# Patient Record
Sex: Male | Born: 1965 | Race: Black or African American | Hispanic: No | Marital: Married | Smoking: Former smoker
Health system: Southern US, Community
[De-identification: ages and names within clinical notes are randomized; demographics above are authoritative.]

## PROBLEM LIST (undated history)

## (undated) DIAGNOSIS — E785 Hyperlipidemia, unspecified: Secondary | ICD-10-CM

## (undated) DIAGNOSIS — I4891 Unspecified atrial fibrillation: Secondary | ICD-10-CM

## (undated) DIAGNOSIS — I517 Cardiomegaly: Secondary | ICD-10-CM

## (undated) DIAGNOSIS — I10 Essential (primary) hypertension: Secondary | ICD-10-CM

## (undated) DIAGNOSIS — G4733 Obstructive sleep apnea (adult) (pediatric): Secondary | ICD-10-CM

## (undated) DIAGNOSIS — W3400XA Accidental discharge from unspecified firearms or gun, initial encounter: Secondary | ICD-10-CM

## (undated) DIAGNOSIS — I429 Cardiomyopathy, unspecified: Secondary | ICD-10-CM

## (undated) HISTORY — DX: Cardiomyopathy, unspecified: I42.9

## (undated) HISTORY — DX: Morbid (severe) obesity due to excess calories: E66.01

## (undated) HISTORY — DX: Accidental discharge from unspecified firearms or gun, initial encounter: W34.00XA

## (undated) HISTORY — DX: Hyperlipidemia, unspecified: E78.5

## (undated) HISTORY — DX: Cardiomegaly: I51.7

## (undated) HISTORY — DX: Obstructive sleep apnea (adult) (pediatric): G47.33

---
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
## 1999-05-31 ENCOUNTER — Ambulatory Visit (HOSPITAL_COMMUNITY): Admission: RE | Admit: 1999-05-31 | Discharge: 1999-05-31 | Payer: Self-pay | Admitting: Nephrology

## 1999-05-31 ENCOUNTER — Encounter: Payer: Self-pay | Admitting: Nephrology

## 1999-06-02 ENCOUNTER — Ambulatory Visit (HOSPITAL_COMMUNITY): Admission: RE | Admit: 1999-06-02 | Discharge: 1999-06-02 | Payer: Self-pay | Admitting: Nephrology

## 1999-06-02 ENCOUNTER — Encounter: Payer: Self-pay | Admitting: Nephrology

## 1999-06-14 ENCOUNTER — Emergency Department (HOSPITAL_COMMUNITY): Admission: EM | Admit: 1999-06-14 | Discharge: 1999-06-14 | Payer: Self-pay | Admitting: Emergency Medicine

## 2006-01-21 ENCOUNTER — Emergency Department (HOSPITAL_COMMUNITY): Admission: EM | Admit: 2006-01-21 | Discharge: 2006-01-21 | Payer: Self-pay | Admitting: Emergency Medicine

## 2009-01-26 DIAGNOSIS — I4819 Other persistent atrial fibrillation: Secondary | ICD-10-CM

## 2009-01-26 HISTORY — DX: Other persistent atrial fibrillation: I48.19

## 2012-02-26 ENCOUNTER — Emergency Department (HOSPITAL_COMMUNITY)
Admission: EM | Admit: 2012-02-26 | Discharge: 2012-02-27 | Disposition: A | Discharge status: Home / Self Care | Payer: No Typology Code available for payment source | Attending: Emergency Medicine | Admitting: Emergency Medicine

## 2012-02-26 ENCOUNTER — Encounter (HOSPITAL_COMMUNITY): Payer: Self-pay | Admitting: *Deleted

## 2012-02-26 DIAGNOSIS — F17200 Nicotine dependence, unspecified, uncomplicated: Secondary | ICD-10-CM | POA: Insufficient documentation

## 2012-02-26 DIAGNOSIS — M545 Low back pain, unspecified: Secondary | ICD-10-CM | POA: Insufficient documentation

## 2012-02-26 DIAGNOSIS — M25519 Pain in unspecified shoulder: Secondary | ICD-10-CM | POA: Insufficient documentation
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
## 2012-02-27 MED ORDER — OXYCODONE-ACETAMINOPHEN 5-325 MG PO TABS: 1.0000 | ORAL_TABLET | ORAL | Status: DC | PRN
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
## 2015-08-31 ENCOUNTER — Ambulatory Visit: Payer: Self-pay | Attending: Internal Medicine

## 2015-09-08 ENCOUNTER — Ambulatory Visit: Payer: Self-pay | Admitting: Family Medicine

## 2019-02-05 ENCOUNTER — Inpatient Hospital Stay (HOSPITAL_COMMUNITY)
Admission: EM | Admit: 2019-02-05 | Discharge: 2019-02-09 | DRG: 308 | Disposition: A | Discharge status: Home / Self Care | Payer: No Typology Code available for payment source | Attending: Cardiovascular Disease | Admitting: Cardiovascular Disease

## 2019-02-05 ENCOUNTER — Encounter (HOSPITAL_COMMUNITY): Payer: Self-pay | Admitting: Emergency Medicine

## 2019-02-05 ENCOUNTER — Other Ambulatory Visit: Payer: Self-pay

## 2019-02-05 ENCOUNTER — Emergency Department (HOSPITAL_COMMUNITY): Payer: Self-pay

## 2019-02-05 DIAGNOSIS — I493 Ventricular premature depolarization: Secondary | ICD-10-CM | POA: Diagnosis not present

## 2019-02-05 DIAGNOSIS — E669 Obesity, unspecified: Secondary | ICD-10-CM | POA: Diagnosis present

## 2019-02-05 DIAGNOSIS — E876 Hypokalemia: Secondary | ICD-10-CM | POA: Diagnosis not present

## 2019-02-05 DIAGNOSIS — Z8249 Family history of ischemic heart disease and other diseases of the circulatory system: Secondary | ICD-10-CM

## 2019-02-05 DIAGNOSIS — I10 Essential (primary) hypertension: Secondary | ICD-10-CM

## 2019-02-05 DIAGNOSIS — E119 Type 2 diabetes mellitus without complications: Secondary | ICD-10-CM | POA: Diagnosis present

## 2019-02-05 DIAGNOSIS — Z6834 Body mass index (BMI) 34.0-34.9, adult: Secondary | ICD-10-CM

## 2019-02-05 DIAGNOSIS — F1210 Cannabis abuse, uncomplicated: Secondary | ICD-10-CM | POA: Diagnosis present

## 2019-02-05 DIAGNOSIS — I4891 Unspecified atrial fibrillation: Secondary | ICD-10-CM | POA: Diagnosis not present

## 2019-02-05 DIAGNOSIS — R079 Chest pain, unspecified: Secondary | ICD-10-CM

## 2019-02-05 DIAGNOSIS — Z833 Family history of diabetes mellitus: Secondary | ICD-10-CM

## 2019-02-05 DIAGNOSIS — F17290 Nicotine dependence, other tobacco product, uncomplicated: Secondary | ICD-10-CM | POA: Diagnosis present

## 2019-02-05 DIAGNOSIS — I5021 Acute systolic (congestive) heart failure: Secondary | ICD-10-CM | POA: Diagnosis present

## 2019-02-05 DIAGNOSIS — Z23 Encounter for immunization: Secondary | ICD-10-CM

## 2019-02-05 DIAGNOSIS — E118 Type 2 diabetes mellitus with unspecified complications: Secondary | ICD-10-CM

## 2019-02-05 DIAGNOSIS — I110 Hypertensive heart disease with heart failure: Secondary | ICD-10-CM | POA: Diagnosis present

## 2019-02-05 DIAGNOSIS — Z20828 Contact with and (suspected) exposure to other viral communicable diseases: Secondary | ICD-10-CM | POA: Diagnosis present

## 2019-02-05 DIAGNOSIS — N179 Acute kidney failure, unspecified: Secondary | ICD-10-CM | POA: Diagnosis present

## 2019-02-05 DIAGNOSIS — I248 Other forms of acute ischemic heart disease: Secondary | ICD-10-CM | POA: Diagnosis present

## 2019-02-05 DIAGNOSIS — Z720 Tobacco use: Secondary | ICD-10-CM

## 2019-02-05 HISTORY — DX: Essential (primary) hypertension: I10

## 2019-02-05 LAB — BASIC METABOLIC PANEL
Anion gap: 10 (ref 5–15)
BUN: 21 mg/dL — ABNORMAL HIGH (ref 6–20)
CO2: 19 mmol/L — ABNORMAL LOW (ref 22–32)
Calcium: 8.9 mg/dL (ref 8.9–10.3)
Chloride: 110 mmol/L (ref 98–111)
Creatinine, Ser: 1.35 mg/dL — ABNORMAL HIGH (ref 0.61–1.24)
GFR calc Af Amer: >60 mL/min (ref >60)
GFR calc non Af Amer: >60 mL/min (ref >60)
Glucose, Bld: 131 mg/dL — ABNORMAL HIGH (ref 70–99)
Potassium: 3.8 mmol/L (ref 3.5–5.1)
Sodium: 139 mmol/L (ref 135–145)

## 2019-02-05 LAB — CBC
HCT: 44.3 % (ref 39.0–52.0)
Hemoglobin: 14.9 g/dL (ref 13.0–17.0)
MCH: 32.2 pg (ref 26.0–34.0)
MCHC: 33.6 g/dL (ref 30.0–36.0)
MCV: 95.7 fL (ref 80.0–100.0)
Platelets: 248 10*3/uL (ref 150–400)
RBC: 4.63 MIL/uL (ref 4.22–5.81)
RDW: 14.3 % (ref 11.5–15.5)
WBC: 10 10*3/uL (ref 4.0–10.5)
nRBC: 0 % (ref 0.0–0.2)

## 2019-02-05 LAB — SARS CORONAVIRUS 2 BY RT PCR (HOSPITAL ORDER, PERFORMED IN ~~LOC~~ HOSPITAL LAB): SARS Coronavirus 2: NEGATIVE

## 2019-02-05 LAB — TROPONIN I (HIGH SENSITIVITY)
Troponin I (High Sensitivity): 23 ng/L — ABNORMAL HIGH (ref <18)
Troponin I (High Sensitivity): 22 ng/L — ABNORMAL HIGH (ref <18)

## 2019-02-05 LAB — TSH: TSH: 3.649 u[IU]/mL (ref 0.350–4.500)

## 2019-02-05 LAB — BRAIN NATRIURETIC PEPTIDE: B Natriuretic Peptide: 333 pg/mL — ABNORMAL HIGH (ref 0.0–100.0)

## 2019-02-05 MED ORDER — DILTIAZEM HCL-DEXTROSE 100-5 MG/100ML-% IV SOLN (PREMIX): 5.0000 mg/h | INTRAVENOUS | Status: DC

## 2019-02-05 MED ORDER — NITROGLYCERIN 0.4 MG SL SUBL: 0.4000 mg | SUBLINGUAL_TABLET | SUBLINGUAL | Status: DC | PRN

## 2019-02-05 MED ORDER — METOPROLOL TARTRATE 25 MG PO TABS
50.0000 mg | ORAL_TABLET | Freq: Two times a day (BID) | ORAL | Status: DC
  Administered 2019-02-05: 20:00:00 50 mg via ORAL
  Filled 2019-02-05: qty 2

## 2019-02-05 MED ORDER — DILTIAZEM LOAD VIA INFUSION
10.0000 mg | Freq: Once | INTRAVENOUS | Status: AC
  Administered 2019-02-05: 10 mg via INTRAVENOUS
  Filled 2019-02-05: qty 10

## 2019-02-05 MED ORDER — HEPARIN (PORCINE) 25000 UT/250ML-% IV SOLN: 1700.0000 [IU]/h | INTRAVENOUS | Status: DC

## 2019-02-05 MED ORDER — HEPARIN SODIUM (PORCINE) 5000 UNIT/ML IJ SOLN
4000.0000 [IU] | Freq: Once | INTRAMUSCULAR | Status: AC
  Administered 2019-02-05: 4000 [IU] via INTRAVENOUS
  Filled 2019-02-05: qty 1

## 2019-02-05 MED ORDER — ONDANSETRON HCL 4 MG/2ML IJ SOLN: 4.0000 mg | Freq: Four times a day (QID) | INTRAMUSCULAR | Status: DC | PRN

## 2019-02-05 MED ORDER — ACETAMINOPHEN 325 MG PO TABS
650.0000 mg | ORAL_TABLET | ORAL | Status: DC | PRN
  Administered 2019-02-06: 650 mg via ORAL
  Filled 2019-02-05: qty 2

## 2019-02-05 MED ORDER — APIXABAN 5 MG PO TABS
5.0000 mg | ORAL_TABLET | Freq: Two times a day (BID) | ORAL | Status: DC
  Administered 2019-02-05 – 2019-02-09 (×8): 5 mg via ORAL
  Filled 2019-02-05 (×9): qty 1

## 2019-02-05 MED ORDER — DILTIAZEM HCL-DEXTROSE 100-5 MG/100ML-% IV SOLN (PREMIX)
5.0000 mg/h | INTRAVENOUS | Status: DC
  Administered 2019-02-05: 5 mg/h via INTRAVENOUS
  Administered 2019-02-05: 22:00:00 15 mg/h via INTRAVENOUS
  Administered 2019-02-07: 01:00:00 10 mg/h via INTRAVENOUS
  Filled 2019-02-05 (×6): qty 100

## 2019-02-05 NOTE — ED Triage Notes (Signed)
Patient reports being sent from [place] in [place] due to his heart rate being elevated. Patient reports feeling off with some shortness of breath x 3 days. Patient reports having a slight tightness to left chest.

## 2019-02-05 NOTE — H&P (Addendum)
Cardiology Admission History and Physical:   Patient ID: KOEHN HARROW MRN: [id number]; DOB: 21-Oct-1965   Admission date: 02/05/2019  Primary Care Provider: Patient, No Pcp Per Primary Cardiologist: Dr. Eden Emms ([place])  Chief Complaint:  afib rvr  Patient Profile:   Robert Middleton is a 53 y.o. male with history of hypertension and strong family history of CAD presents for evaluation of atrial fibrillation with rapid ventricular rate from [place].  He was diagnosed with hypertension February 2020.  He takes amlodipine 5 mg daily.  He smokes cigar and marijuana multiple days per week, but not every day.  He drinks 1 pint per week.  He works as a Aeronautical engineer person.  Father had MI at age 7 requiring CABG.  Died of cardiac complication at age 60. Elder brother had MI at age 66. Brother has diabetes.  History of Present Illness:   Mr. Perpich started to feel fatigued and tired last Thursday.  Since Monday he has intermittent palpitation with shortness of breath.  He reports constant palpitation for past 2 days.  He has associated shortness of breath.  No chest pain, orthopnea, dizziness, PND, syncope, lower extremity edema or melena.  Compliant with low-sodium diet and his medication.  He went to see his provider at [facility] and noted to be in atrial fibrillation with rapid ventricular rate and sent to ER.  Heart rate sustaining in 150s.  He just started on IV Cardizem and IV heparin.  Blood pressure 128/97.  BNP 333.  Glucose 131.  Creatinine 1.35.  Chest x-ray with pulmonary edema likely.  Heart Pathway Score:  HEAR Score: 2  Past Medical History:  Diagnosis Date  . Hypertension     History reviewed. No pertinent surgical history.   Medications Prior to Admission: Prior to Admission medications   Medication Sig Start Date End Date Taking? Authorizing Provider  amLODipine (NORVASC) 5 MG tablet Take 5 mg by mouth daily.   Yes [provider]  BLACK ELDERBERRY PO Take 1 tablet by  mouth daily. W/ cloves and honey   Yes [provider]  ibuprofen (ADVIL) 200 MG tablet Take 400 mg by mouth every 6 (six) hours as needed for moderate pain.   Yes [provider]  Multiple Vitamins-Minerals (MULTIVITAMIN WITH MINERALS) tablet Take 1 tablet by mouth daily.   Yes [provider]  naproxen sodium (ANAPROX) 220 MG tablet Take 220 mg by mouth 2 (two) times daily with a meal.    [provider]  oxyCODONE-acetaminophen (PERCOCET/ROXICET) 5-325 MG per tablet Take 1 tablet by mouth every 4 (four) hours as needed for pain. Patient not taking: Reported on 02/05/2019 02/27/12   Zadie Rhine, MD     Allergies:   No Known Allergies  Social History:   Social History   Socioeconomic History  . Marital status: Single    Spouse name: Not on file  . Number of children: Not on file  . Years of education: Not on file  . Highest education level: Not on file  Occupational History  . Not on file  Social Needs  . Financial resource strain: Not on file  . Food insecurity    Worry: Not on file    Inability: Not on file  . Transportation needs    Medical: Not on file    Non-medical: Not on file  Tobacco Use  . Smoking status: Current Every Day Smoker  . Smokeless tobacco: Never Used  Substance and Sexual Activity  . Alcohol use: Yes  .  Drug use: Not on file  . Sexual activity: Not on file  Lifestyle  . Physical activity    Days per week: Not on file    Minutes per session: Not on file  . Stress: Not on file  Relationships  . Social Herbalist on phone: Not on file    Gets together: Not on file    Attends religious service: Not on file    Active member of club or organization: Not on file    Attends meetings of clubs or organizations: Not on file    Relationship status: Not on file  . Intimate partner violence    Fear of current or ex partner: Not on file    Emotionally abused: Not on file    Physically abused: Not on file     Forced sexual activity: Not on file  Other Topics Concern  . Not on file  Social History Narrative  . Not on file    Family History:  The patient's family history includes CAD in his mother; Heart attack (age of onset: 19) in his mother; Heart attack (age of onset: 66) in his brother; Heart disease in his mother.    ROS:  Please see the history of present illness.  All other ROS reviewed and negative.     Physical Exam/Data:   Vitals:   02/05/19 1316 02/05/19 1333 02/05/19 1345 02/05/19 1415  BP:  (!) 124/103 (!) 120/97 (!) 119/98  Pulse:  (!) 144 73 67  Resp:  (!) 27 13 (!) 26  Temp:      TempSrc:      SpO2:  97% 94% 97%  Weight: 116.1 kg     Height: 6' (1.829 m)      No intake or output data in the 24 hours ending 02/05/19 1545 Last 3 Weights 02/05/2019  Weight (lbs) 256 lb  Weight (kg) 116.121 kg     Body mass index is 34.72 kg/m.  General:  Well nourished, well developed, in no acute distress HEENT: normal Lymph: no adenopathy Neck: no JVD Endocrine:  No thryomegaly Vascular: No carotid bruits; FA pulses 2+ bilaterally without bruits  Cardiac:  normal S1, S2; irregularly irregular tachycardic; no murmur  Lungs: Diminished breath sounds at base bilaterally abd: soft, nontender, no hepatomegaly  Ext: Trace edema Musculoskeletal:  No deformities, BUE and BLE strength normal and equal Skin: warm and dry  Neuro:  CNs 2-12 intact, no focal abnormalities noted Psych:  Normal affect    EKG:  The ECG that was done today was personally reviewed and demonstrates atrial fibrillation at rate of 164 bpm  Relevant CV Studies: As above  Laboratory Data:  Chemistry Recent Labs  Lab 02/05/19 1319  NA 139  K 3.8  CL 110  CO2 19*  GLUCOSE 131*  BUN 21*  CREATININE 1.35*  CALCIUM 8.9  GFRNONAA >60  GFRAA >60  ANIONGAP 10    Hematology Recent Labs  Lab 02/05/19 1319  WBC 10.0  RBC 4.63  HGB 14.9  HCT 44.3  MCV 95.7  MCH 32.2  MCHC 33.6  RDW 14.3  PLT  248   BNP Recent Labs  Lab 02/05/19 1443  BNP 333.0*     Radiology/Studies:  Dg Chest 2 View  Result Date: 02/05/2019 CLINICAL DATA:  Shortness of breath EXAM: CHEST - 2 VIEW COMPARISON:  None. FINDINGS: Cardiomegaly. Mild, diffuse bilateral interstitial pulmonary opacity and trace pleural effusions. Disc degenerative disease of the thoracic spine. IMPRESSION: Cardiomegaly  with mild, diffuse bilateral interstitial pulmonary opacity and trace pleural effusions, likely edema. No focal airspace opacity. Electronically Signed   By: Lauralyn PrimesAlex  Bibbey M.D.   On: 02/05/2019 14:26    Assessment and Plan:   1. New onset atrial fibrillation with rapid ventricular rate He is symptomatic with rapid rate.  Agree with IV diltiazem.  Chadvascs of 1 for hypertension.  Get TSH and echocardiogram.  Check A1c for elevated blood sugar. - Add lopressor 50mg  BID. Stop heparin. Add eliquis 5 mg BID.   2.  Hypertension -Blood pressure stable.  Hold home amlodipine. Add BB and IV cardizem as above.   3.  Cigar and marijuana abuse -Advised cessation.  Severity of Illness: The appropriate patient status for this patient is OBSERVATION. Observation status is judged to be reasonable and necessary in order to provide the required intensity of service to ensure the patient's safety. The patient's presenting symptoms, physical exam findings, and initial radiographic and laboratory data in the context of their medical condition is felt to place them at decreased risk for further clinical deterioration. Furthermore, it is anticipated that the patient will be medically stable for discharge from the hospital within 2 midnights of admission. The following factors support the patient status of observation.   " The patient's presenting symptoms include palpitations . " The physical exam findings include mild edema " The initial radiographic and laboratory data are AFib RBR     For questions or updates, please contact [facility] Please consult [email or web address] for contact info under        Lorelei PontSigned, Bhavinkumar Bhagat, [place]  02/05/2019 3:45 PM   Patient examined chart reviewed discussed care with patient, son and PA. Obese black male no murmur clear lungs no edema good peripheral pulses no bruits No bleeding diathesis. Admitted with no onset afib CHADVASC 1 for HTN but will start eliquis and continue for at least a month post conversion. Start lopressor 50 mg PO bid D/C iv cardizem in am Echo to assess heart in am. Consider d/c in am if echo ok and rate controlled Elective DCC 3 weeks if he does not convert on his own. Check TSH Discussed cessation of smoking and marijuana use   Charlton HawsPeter Tomeika Weinmann

## 2019-02-05 NOTE — ED Notes (Signed)
Patient transported to X-ray 

## 2019-02-05 NOTE — Progress Notes (Signed)
ANTICOAGULATION CONSULT NOTE - Initial Consult  Pharmacy Consult for Heparin Indication: atrial fibrillation  No Known Allergies  Patient Measurements: Height: 6' (182.9 cm) Weight: 256 lb (116.1 kg) IBW/kg (Calculated) : 77.6 Heparin Dosing Weight: 102.7 kg  Vital Signs: Temp: 98.3 F (36.8 C) (09/10 1309) Temp Source: Oral (09/10 1309) BP: 119/98 (09/10 1415) Pulse Rate: 67 (09/10 1415)  Labs: Recent Labs    02/05/19 1319  HGB 14.9  HCT 44.3  PLT 248  CREATININE 1.35*    Estimated Creatinine Clearance: 85.2 mL/min (A) (by C-G formula based on SCr of 1.35 mg/dL (H)).   Medical History: Past Medical History:  Diagnosis Date  . Hypertension     Medications:  Scheduled:  . diltiazem  10 mg Intravenous Once   Infusions:  . diltiazem (CARDIZEM) infusion    . heparin      Assessment: 53 y.o. male presenting with chest discomfort. Newly diagnosed with Afib on 9/6. No anticoagulation PTA. Hgb 14.9, Plts 248, Scr 1.35. Pharmacy consulted for heparin dosing.    Goal of Therapy:  Heparin level 0.3-0.7 units/ml Monitor platelets by anticoagulation protocol: Yes   Plan: Heparin bolus 4000 units Heparin 1700 units/hr Check 6 hr initial HL Daily HL and CBC Monitor s/sx of bleeding   Lorel Monaco, PharmD PGY1 Ambulatory Care Resident [employer] # [phone]

## 2019-02-05 NOTE — ED Provider Notes (Signed)
[facility] EMERGENCY DEPARTMENT Provider Note   CSN: [id number] Arrival date & time: 02/05/19  1300     History   Chief Complaint Chief Complaint  Patient presents with  . Atrial Fibrillation    HPI Robert Middleton is a 53 y.o. male.     Pt reports he began feeling some chest discomfort last Thursday Sept 3rd.  Pt reports he felt like his heart began beating fast on Sunday (6th) Pt went [facility] today  and was diagnosed with AFib with RVR.  Pt was sent here for evaluation.  Pt has EKG with him that shows afib with rvr.   The history is provided by the patient. No language interpreter was used.  Atrial Fibrillation This is a new problem. The current episode started more than 2 days ago. The problem occurs constantly. The problem has not changed since onset.Pertinent negatives include no abdominal pain. Nothing aggravates the symptoms. Nothing relieves the symptoms. He has tried nothing for the symptoms. The treatment provided moderate relief.    Past Medical History:  Diagnosis Date  . Hypertension     There are no active problems to display for this patient.   History reviewed. No pertinent surgical history.      Home Medications    Prior to Admission medications   Medication Sig Start Date End Date Taking? Authorizing Provider  amLODipine (NORVASC) 5 MG tablet Take 5 mg by mouth daily.   Yes [provider]  BLACK ELDERBERRY PO Take 1 tablet by mouth daily. W/ cloves and honey   Yes [provider]  ibuprofen (ADVIL) 200 MG tablet Take 400 mg by mouth every 6 (six) hours as needed for moderate pain.   Yes [provider]  Multiple Vitamins-Minerals (MULTIVITAMIN WITH MINERALS) tablet Take 1 tablet by mouth daily.   Yes [provider]  naproxen sodium (ANAPROX) 220 MG tablet Take 220 mg by mouth 2 (two) times daily with a meal.    [provider]  oxyCODONE-acetaminophen (PERCOCET/ROXICET)  5-325 MG per tablet Take 1 tablet by mouth every 4 (four) hours as needed for pain. Patient not taking: Reported on 02/05/2019 02/27/12   Ripley Fraise, MD    Family History No family history on file.  Social History Social History   Tobacco Use  . Smoking status: Current Every Day Smoker  . Smokeless tobacco: Never Used  Substance Use Topics  . Alcohol use: Yes  . Drug use: Not on file     Allergies   Patient has no known allergies.   Review of Systems Review of Systems  Respiratory: Positive for chest tightness.   Cardiovascular: Positive for palpitations.  Gastrointestinal: Negative for abdominal pain.  All other systems reviewed and are negative.    Physical Exam Updated Vital Signs BP (!) 119/98   Pulse 67   Temp 98.3 F (36.8 C) (Oral)   Resp (!) 26   Ht 6' (1.829 m)   Wt 116.1 kg   SpO2 97%   BMI 34.72 kg/m   Physical Exam Vitals signs and nursing note reviewed.  Constitutional:      Appearance: Normal appearance.  HENT:     Head: Normocephalic and atraumatic.     Right Ear: Tympanic membrane normal.     Left Ear: Tympanic membrane normal.     Nose: Nose normal.     Mouth/Throat:     Mouth: Mucous membranes are moist.  Eyes:     Extraocular Movements:  Extraocular movements intact.     Pupils: Pupils are equal, round, and reactive to light.  Neck:     Musculoskeletal: Normal range of motion.  Cardiovascular:     Rate and Rhythm: Tachycardia present.     Pulses: Normal pulses.  Pulmonary:     Effort: Pulmonary effort is normal.  Abdominal:     General: Abdomen is flat.  Musculoskeletal: Normal range of motion.  Skin:    General: Skin is warm.  Neurological:     General: No focal deficit present.     Mental Status: He is alert.  Psychiatric:        Mood and Affect: Mood normal.      ED Treatments / Results  Labs (all labs ordered are listed, but only abnormal results are displayed) Labs Reviewed  BASIC METABOLIC PANEL -  Abnormal; Notable for the following components:      Result Value   CO2 19 (*)    Glucose, Bld 131 (*)    BUN 21 (*)    Creatinine, Ser 1.35 (*)    All other components within normal limits  CBC    EKG None  Radiology Dg Chest 2 View  Result Date: 02/05/2019 CLINICAL DATA:  Shortness of breath EXAM: CHEST - 2 VIEW COMPARISON:  None. FINDINGS: Cardiomegaly. Mild, diffuse bilateral interstitial pulmonary opacity and trace pleural effusions. Disc degenerative disease of the thoracic spine. IMPRESSION: Cardiomegaly with mild, diffuse bilateral interstitial pulmonary opacity and trace pleural effusions, likely edema. No focal airspace opacity. Electronically Signed   By: Lauralyn Primes M.D.   On: 02/05/2019 14:26    Procedures Procedures (including critical care time)  Medications Ordered in ED Medications  diltiazem (CARDIZEM) 1 mg/mL load via infusion 10 mg (has no administration in time range)    And  diltiazem (CARDIZEM) 100 mg in dextrose 5% (1 mg/mL) infusion (has no administration in time range)     Initial Impression / Assessment and Plan / ED Course  I have reviewed the triage vital signs and the nursing notes.  Pertinent labs & imaging results that were available during my care of the patient were reviewed by me and considered in my medical decision making (see chart for details).         MDM  EKG shows afib with rvr. [facility]  Pt started on cardizem bolus and drip,  Heparin ordered.   Cardiology consulted.   Final Clinical Impressions(s) / ED Diagnoses   Final diagnoses:  Atrial fibrillation with RVR (HCC)  Chest pain, unspecified type    ED Discharge Orders    None       Elson Areas, [place] 02/05/19 1509    Arby Barrette, MD 02/06/19 1145

## 2019-02-06 ENCOUNTER — Encounter (HOSPITAL_COMMUNITY): Payer: Self-pay

## 2019-02-06 ENCOUNTER — Inpatient Hospital Stay (HOSPITAL_COMMUNITY): Payer: No Typology Code available for payment source

## 2019-02-06 DIAGNOSIS — I340 Nonrheumatic mitral (valve) insufficiency: Secondary | ICD-10-CM

## 2019-02-06 DIAGNOSIS — I4819 Other persistent atrial fibrillation: Secondary | ICD-10-CM | POA: Diagnosis not present

## 2019-02-06 LAB — CBC
HCT: 41.9 % (ref 39.0–52.0)
Hemoglobin: 14.4 g/dL (ref 13.0–17.0)
MCH: 32.4 pg (ref 26.0–34.0)
MCHC: 34.4 g/dL (ref 30.0–36.0)
MCV: 94.4 fL (ref 80.0–100.0)
Platelets: 225 10*3/uL (ref 150–400)
RBC: 4.44 MIL/uL (ref 4.22–5.81)
RDW: 14.3 % (ref 11.5–15.5)
WBC: 8.6 10*3/uL (ref 4.0–10.5)
nRBC: 0 % (ref 0.0–0.2)

## 2019-02-06 LAB — CBG MONITORING, ED: Glucose-Capillary: 152 mg/dL — ABNORMAL HIGH (ref 70–99)

## 2019-02-06 LAB — BASIC METABOLIC PANEL
Anion gap: 9 (ref 5–15)
BUN: 18 mg/dL (ref 6–20)
CO2: 20 mmol/L — ABNORMAL LOW (ref 22–32)
Calcium: 8.7 mg/dL — ABNORMAL LOW (ref 8.9–10.3)
Chloride: 111 mmol/L (ref 98–111)
Creatinine, Ser: 1.34 mg/dL — ABNORMAL HIGH (ref 0.61–1.24)
GFR calc Af Amer: >60 mL/min (ref >60)
GFR calc non Af Amer: >60 mL/min (ref >60)
Glucose, Bld: 125 mg/dL — ABNORMAL HIGH (ref 70–99)
Potassium: 3.6 mmol/L (ref 3.5–5.1)
Sodium: 140 mmol/L (ref 135–145)

## 2019-02-06 LAB — LIPID PANEL
Cholesterol: 138 mg/dL (ref 0–200)
HDL: 33 mg/dL — ABNORMAL LOW (ref >40)
LDL Cholesterol: 92 mg/dL (ref 0–99)
Total CHOL/HDL Ratio: 4.2 RATIO
Triglycerides: 64 mg/dL (ref <150)
VLDL: 13 mg/dL (ref 0–40)

## 2019-02-06 LAB — ECHOCARDIOGRAM COMPLETE
Height: 72 in
Weight: 4096 oz

## 2019-02-06 LAB — HIV ANTIBODY (ROUTINE TESTING W REFLEX): HIV Screen 4th Generation wRfx: NONREACTIVE

## 2019-02-06 MED ORDER — BISMUTH SUBSALICYLATE 262 MG/15ML PO SUSP
30.0000 mL | Freq: Two times a day (BID) | ORAL | Status: DC | PRN
  Administered 2019-02-06 – 2019-02-08 (×2): 30 mL via ORAL
  Filled 2019-02-06: qty 236

## 2019-02-06 MED ORDER — INFLUENZA VAC SPLIT QUAD 0.5 ML IM SUSY
0.5000 mL | PREFILLED_SYRINGE | INTRAMUSCULAR | Status: AC
  Administered 2019-02-07: 10:00:00 0.5 mL via INTRAMUSCULAR
  Filled 2019-02-06: qty 0.5

## 2019-02-06 MED ORDER — METOPROLOL TARTRATE 50 MG PO TABS
75.0000 mg | ORAL_TABLET | Freq: Two times a day (BID) | ORAL | Status: DC
  Administered 2019-02-06 – 2019-02-07 (×3): 75 mg via ORAL
  Filled 2019-02-06: qty 3
  Filled 2019-02-06 (×2): qty 1

## 2019-02-06 MED ORDER — SODIUM CHLORIDE 0.9 % IV SOLN
INTRAVENOUS | Status: AC
  Administered 2019-02-06: via INTRAVENOUS

## 2019-02-06 NOTE — Discharge Instructions (Addendum)

## 2019-02-06 NOTE — ED Notes (Signed)
Echo at bedside

## 2019-02-06 NOTE — ED Notes (Signed)
Cardiology at bedside.

## 2019-02-06 NOTE — Progress Notes (Signed)
Echo showed low EF. Will keep over weekend for TEE/DCCV on Monday with Dr. Johnsie Cancel at 1 pm.Paln to add ACE/ARB but will hold today due to soft blood pressure.

## 2019-02-06 NOTE — H&P (View-Only) (Signed)
Echo shows EF 20-25% Asked PA to inform Patient as I am off Will start Ace/entresto and keep in hospital Plan TEE/DCC Monday Can have cardiac CT to r/o CAD after restoration of NSR

## 2019-02-06 NOTE — Progress Notes (Signed)
Responded to Spiritual Consult.  Pt in major life transition with heart issues.  In 2018 buried one brother and 2019 buried another brother, both had died from heart disease as had their father.  Pt is afraid.  After establishing a relationship of care and trust and support, offered Pt prayer out loud at bedside.  Chaplain available if needed further.  De Burrs Chaplain Resident [phone]

## 2019-02-06 NOTE — ED Notes (Signed)
ED TO INPATIENT HANDOFF REPORT  ED Nurse Name and Phone #: Florentina Addison [phone]  S Name/Age/Gender Robert Middleton 53 y.o. male Room/Bed: 034C/034C  Code Status   Code Status: Full Code  Home/SNF/Other Home Patient oriented to: self, place, time and situation Is this baseline? Yes   Triage Complete: Triage complete  [facility]  T[facility] Note Patient reports being sent from [place] in [place] due to his heart rate being elevated. Patient reports feeling off with some shortness of breath x 3 days. Patient reports having a slight tightness to left chest.    Allergies No Known Allergies  Level of Care/Admitting Diagnosis ED Disposition    ED Disposition Condition Comment   Admit  Hospital Area: [facility] [[id number]]  Level of Care: Progressive [102]  Covid Evaluation: Confirmed COVID Negative  Diagnosis: Atrial fibrillation (HCC) [427.31.ICD-9-CM]  Admitting Physician: Vivien Presto  Attending Physician: Wendall Stade [phone]  Estimated length of stay: past midnight tomorrow  Certification:: I certify this patient will need inpatient services for at least 2 midnights  PT Class (Do Not Modify): Inpatient [101]  PT Acc Code (Do Not Modify): Private [1]       B Medical/Surgery History Past Medical History:  Diagnosis Date  . Hypertension    History reviewed. No pertinent surgical history.   A IV Location/Drains/Wounds Patient Lines/Drains/Airways Status   Active Line/Drains/Airways    Name:   Placement date:   Placement time:   Site:   Days:   Peripheral IV 02/05/19 Left Antecubital   02/05/19    1339    Antecubital   1   Peripheral IV 02/05/19 Right Forearm   02/05/19    1648    Forearm   1          Intake/Output Last 24 hours  Intake/Output Summary (Last 24 hours) at 02/06/2019 1307 Last data filed at 02/06/2019 1307 Gross per 24 hour  Intake 676.8 ml  Output -  Net 676.8 ml    Labs/Imaging Results  for orders placed or performed during the hospital encounter of 02/05/19 (from the past 48 hour(s))  Basic metabolic panel     Status: Abnormal   Collection Time: 02/05/19  1:19 PM  Result Value Ref Range   Sodium 139 135 - 145 mmol/L   Potassium 3.8 3.5 - 5.1 mmol/L   Chloride 110 98 - 111 mmol/L   CO2 19 (L) 22 - 32 mmol/L   Glucose, Bld 131 (H) 70 - 99 mg/dL   BUN 21 (H) 6 - 20 mg/dL   Creatinine, Ser 9.72 (H) 0.61 - 1.24 mg/dL   Calcium 8.9 8.9 - 82.0 mg/dL   GFR calc non Af Amer >60 >60 mL/min   GFR calc Af Amer >60 >60 mL/min   Anion gap 10 5 - 15    Comment: Performed at [facility], [address] [place], [place] [address]  CBC     Status: None   Collection Time: 02/05/19  1:19 PM  Result Value Ref Range   WBC 10.0 4.0 - 10.5 K/uL   RBC 4.63 4.22 - 5.81 MIL/uL   Hemoglobin 14.9 13.0 - 17.0 g/dL   HCT 15.3 79.4 - 32.7 %   MCV 95.7 80.0 - 100.0 fL   MCH 32.2 26.0 - 34.0 pg   MCHC 33.6 30.0 - 36.0 g/dL   RDW 61.4 70.9 - 29.5 %   Platelets 248 150 - 400 K/uL  nRBC 0.0 0.0 - 0.2 %    Comment: Performed at [facility], [place] [address] [place], [place] [address]  Brain natriuretic peptide     Status: Abnormal   Collection Time: 02/05/19  2:43 PM  Result Value Ref Range   B Natriuretic Peptide 333.0 (H) 0.0 - 100.0 pg/mL    Comment: Performed at [place] [address], [place], [place] [address]  Troponin I (High Sensitivity)     Status: Abnormal   Collection Time: 02/05/19  4:28 PM  Result Value Ref Range   Troponin I (High Sensitivity) 23 (H) <18 ng/L    Comment: (NOTE) Elevated high sensitivity troponin I (hsTnI) values and significant  changes across serial measurements may suggest ACS but many other  chronic and acute conditions are known to elevate hsTnI results.  Refer to the "Links" section for chest pain algorithms and additional  guidance. Performed at [facility], [place] [address] [place], [place] [address]   SARS  Coronavirus 2 [facility] order, Performed in [facility]) Nasopharyngeal Nasopharyngeal Swab     Status: None   Collection Time: 02/05/19  6:30 PM   Specimen: Nasopharyngeal Swab  Result Value Ref Range   SARS Coronavirus 2 NEGATIVE NEGATIVE    Comment: (NOTE) If result is NEGATIVE SARS-CoV-2 target nucleic acids are NOT DETECTED. The SARS-CoV-2 RNA is generally detectable in upper and lower  respiratory specimens during the acute phase of infection. The lowest  concentration of SARS-CoV-2 viral copies this assay can detect is 250  copies / mL. A negative result does not preclude SARS-CoV-2 infection  and should not be used as the sole basis for treatment or other  patient management decisions.  A negative result may occur with  improper specimen collection / handling, submission of specimen other  than nasopharyngeal swab, presence of viral mutation(s) within the  areas targeted by this assay, and inadequate number of viral copies  (<250 copies / mL). A negative result must be combined with clinical  observations, patient history, and epidemiological information. If result is POSITIVE SARS-CoV-2 target nucleic acids are DETECTED. The SARS-CoV-2 RNA is generally detectable in upper and lower  respiratory specimens dur ing the acute phase of infection.  Positive  results are indicative of active infection with SARS-CoV-2.  Clinical  correlation with patient history and other diagnostic information is  necessary to determine patient infection status.  Positive results do  not rule out bacterial infection or co-infection with other viruses. If result is PRESUMPTIVE POSTIVE SARS-CoV-2 nucleic acids MAY BE PRESENT.   A presumptive positive result was obtained on the submitted specimen  and confirmed on repeat testing.  While 2019 novel coronavirus  (SARS-CoV-2) nucleic acids may be present in the submitted sample  additional confirmatory testing may be necessary for  epidemiological  and / or clinical management purposes  to differentiate between  SARS-CoV-2 and other Sarbecovirus currently known to infect humans.  If clinically indicated additional testing with an alternate test  methodology [phone]) is advised. The SARS-CoV-2 RNA is generally  detectable in upper and lower respiratory sp ecimens during the acute  phase of infection. The expected result is Negative. Fact Sheet for Patients:  StrictlyIdeas.no Fact Sheet for Healthcare Providers: BankingDealers.co.za This test is not yet approved or cleared by the [place] FDA and has been authorized for detection and/or diagnosis of SARS-CoV-2 by FDA under an Emergency Use Authorization (EUA).  This EUA will remain in effect (meaning  this test can be used) for the duration of the COVID-19 declaration under Section 564(b)(1) of the Act, 21 U.S.C. section 360bbb-3(b)(1), unless the authorization is terminated or revoked sooner. Performed at [facility], [address] [place], [place] [address]   Troponin I (High Sensitivity)     Status: Abnormal   Collection Time: 02/05/19  6:30 PM  Result Value Ref Range   Troponin I (High Sensitivity) 22 (H) <18 ng/L    Comment: (NOTE) Elevated high sensitivity troponin I (hsTnI) values and significant  changes across serial measurements may suggest ACS but many other  chronic and acute conditions are known to elevate hsTnI results.  Refer to the "Links" section for chest pain algorithms and additional  guidance. Performed at [facility], [address] [place], [place] [address]   TSH     Status: None   Collection Time: 02/05/19  9:10 PM  Result Value Ref Range   TSH 3.649 0.350 - 4.500 uIU/mL    Comment: Performed by a 3rd Generation assay with a functional sensitivity of <=0.01 uIU/mL. Performed at [facility], [address] [place], [place] [address]   CBC     Status: None    Collection Time: 02/06/19  4:13 AM  Result Value Ref Range   WBC 8.6 4.0 - 10.5 K/uL   RBC 4.44 4.22 - 5.81 MIL/uL   Hemoglobin 14.4 13.0 - 17.0 g/dL   HCT 21.341.9 08.639.0 - 57.852.0 %   MCV 94.4 80.0 - 100.0 fL   MCH 32.4 26.0 - 34.0 pg   MCHC 34.4 30.0 - 36.0 g/dL   RDW 46.914.3 62.911.5 - 52.815.5 %   Platelets 225 150 - 400 K/uL   nRBC 0.0 0.0 - 0.2 %    Comment: Performed at [facility], [address] [place], [place] [address]  Lipid panel     Status: Abnormal   Collection Time: 02/06/19  4:13 AM  Result Value Ref Range   Cholesterol 138 0 - 200 mg/dL   Triglycerides 64 <401<150 mg/dL   HDL 33 (L) >02>40 mg/dL   Total CHOL/HDL Ratio 4.2 RATIO   VLDL 13 0 - 40 mg/dL   LDL Cholesterol 92 0 - 99 mg/dL    Comment:        Total Cholesterol/HDL:CHD Risk Coronary Heart Disease Risk Table                     Men   Women  1/2 Average Risk   3.4   3.3  Average Risk       5.0   4.4  2 X Average Risk   9.6   7.1  3 X Average Risk  23.4   11.0        Use the calculated Patient Ratio above and the CHD Risk Table to determine the patient's CHD Risk.        ATP III CLASSIFICATION (LDL):  <100     mg/dL   Optimal  [phone]  mg/dL   Near or Above                    Optimal  130-159  mg/dL   Borderline  [phone]  mg/dL   High  >425>190     mg/dL   Very High Performed at [facility], [address] [place], [place] [address]   Basic metabolic panel     Status: Abnormal  Collection Time: 02/06/19  4:13 AM  Result Value Ref Range   Sodium 140 135 - 145 mmol/L   Potassium 3.6 3.5 - 5.1 mmol/L   Chloride 111 98 - 111 mmol/L   CO2 20 (L) 22 - 32 mmol/L   Glucose, Bld 125 (H) 70 - 99 mg/dL   BUN 18 6 - 20 mg/dL   Creatinine, Ser 4.091.34 (H) 0.61 - 1.24 mg/dL   Calcium 8.7 (L) 8.9 - 10.3 mg/dL   GFR calc non Af Amer >60 >60 mL/min   GFR calc Af Amer >60 >60 mL/min   Anion gap 9 5 - 15    Comment: Performed at [facility], [address] [place], [place] [address]  CBG monitoring, ED      Status: Abnormal   Collection Time: 02/06/19 12:48 PM  Result Value Ref Range   Glucose-Capillary 152 (H) 70 - 99 mg/dL   Dg Chest 2 View  Result Date: 02/05/2019 CLINICAL DATA:  Shortness of breath EXAM: CHEST - 2 VIEW COMPARISON:  None. FINDINGS: Cardiomegaly. Mild, diffuse bilateral interstitial pulmonary opacity and trace pleural effusions. Disc degenerative disease of the thoracic spine. IMPRESSION: Cardiomegaly with mild, diffuse bilateral interstitial pulmonary opacity and trace pleural effusions, likely edema. No focal airspace opacity. Electronically Signed   By: Lauralyn PrimesAlex  Bibbey M.D.   On: 02/05/2019 14:26    Pending Labs Unresulted Labs (From admission, onward)    Start     Ordered   02/06/19 0500  CBC  Daily,   R     02/05/19 1459   02/05/19 1924  HIV antibody (Routine Testing)  Once,   STAT     02/05/19 1923   02/05/19 1924  Hemoglobin A1c  Once,   STAT     02/05/19 1923          Vitals/Pain Today's Vitals   02/06/19 1100 02/06/19 1130 02/06/19 1200 02/06/19 1300  BP: 91/79 (!) 109/94 107/85 108/75  Pulse: 93 78 (!) 28 (!) 116  Resp: (!) 24 18 (!) 25 (!) 24  Temp:      TempSrc:      SpO2: 96% (!) 85% 90% 90%  Weight:      Height:      PainSc:        Isolation Precautions No active isolations  Medications Medications  diltiazem (CARDIZEM) 1 mg/mL load via infusion 10 mg (10 mg Intravenous Bolus from Bag 02/05/19 1531)    And  diltiazem (CARDIZEM) 100 mg in dextrose 5% 100mL (1 mg/mL) infusion (5 mg/hr Intravenous Rate/Dose Verify 02/06/19 1307)  apixaban (ELIQUIS) tablet 5 mg (5 mg Oral Given 02/06/19 0949)  nitroGLYCERIN (NITROSTAT) SL tablet 0.4 mg (has no administration in time range)  acetaminophen (TYLENOL) tablet 650 mg (has no administration in time range)  ondansetron (ZOFRAN) injection 4 mg (has no administration in time range)  0.9 %  sodium chloride infusion ( Intravenous Stopping Infusion hung by another clincian 02/06/19 0251)  metoprolol tartrate  (LOPRESSOR) tablet 75 mg (75 mg Oral Given 02/06/19 0948)  heparin injection 4,000 Units (4,000 Units Intravenous Given 02/05/19 1450)    Mobility walks Low fall risk   Focused Assessments Cardiac Assessment Handoff:  Cardiac Rhythm: Atrial fibrillation No results found for: CKTOTAL, CKMB, CKMBINDEX, TROPONINI No results found for: DDIMER Does the Patient currently have chest pain? No      R Recommendations: See Admitting Provider Note  Report given to:   Additional Notes:

## 2019-02-06 NOTE — Progress Notes (Signed)
  Echocardiogram 2D Echocardiogram has been performed.  Robert Middleton 02/06/2019, 11:40 AM

## 2019-02-06 NOTE — Progress Notes (Addendum)
Progress Note  Patient Name: Robert Middleton Date of Encounter: 02/06/2019  Primary Cardiologist: Robert Haws, MD   Subjective   Dyspnea resolved. No chest pain.   Inpatient Medications    Scheduled Meds: . apixaban  5 mg Oral BID  . metoprolol tartrate  50 mg Oral BID   Continuous Infusions: . diltiazem (CARDIZEM) infusion 10 mg/hr (02/06/19 0526)   PRN Meds: acetaminophen, nitroGLYCERIN, ondansetron (ZOFRAN) IV   Vital Signs    Vitals:   02/06/19 0430 02/06/19 0616 02/06/19 0720 02/06/19 0730  BP: (!) 117/98 (!) 130/107 (!) 104/92 (!) 107/93  Pulse: 100 (!) 125 (!) 46 86  Resp: (!) 26 18 20 16   Temp:      TempSrc:      SpO2: (!) 88% 92% 90% (!) 89%  Weight:      Height:        Intake/Output Summary (Last 24 hours) at 02/06/2019 0801 Last data filed at 02/06/2019 0249 Gross per 24 hour  Intake 594.36 ml  Output -  Net 594.36 ml   Last 3 Weights 02/05/2019  Weight (lbs) 256 lb  Weight (kg) 116.121 kg      Telemetry    AFib at 110-130s - Personally Reviewed  ECG    No new tracing   Physical Exam   GEN: No acute distress.   Neck: No JVD Cardiac: Ir Ir tachycardic, no murmurs, rubs, or gallops.  Respiratory: Clear to auscultation bilaterally. GI: Soft, nontender, non-distended  MS: No edema; No deformity. Neuro:  Nonfocal  Psych: Normal affect   Labs    High Sensitivity Troponin:   Recent Labs  Lab 02/05/19 1628 02/05/19 1830  TROPONINIHS 23* 22*      Chemistry Recent Labs  Lab 02/05/19 1319 02/06/19 0413  NA 139 140  K 3.8 3.6  CL 110 111  CO2 19* 20*  GLUCOSE 131* 125*  BUN 21* 18  CREATININE 1.35* 1.34*  CALCIUM 8.9 8.7*  GFRNONAA >60 >60  GFRAA >60 >60  ANIONGAP 10 9     Hematology Recent Labs  Lab 02/05/19 1319 02/06/19 0413  WBC 10.0 8.6  RBC 4.63 4.44  HGB 14.9 14.4  HCT 44.3 41.9  MCV 95.7 94.4  MCH 32.2 32.4  MCHC 33.6 34.4  RDW 14.3 14.3  PLT 248 225    BNP Recent Labs  Lab 02/05/19 1443  BNP  333.0*      Radiology    Dg Chest 2 View  Result Date: 02/05/2019 CLINICAL DATA:  Shortness of breath EXAM: CHEST - 2 VIEW COMPARISON:  None. FINDINGS: Cardiomegaly. Mild, diffuse bilateral interstitial pulmonary opacity and trace pleural effusions. Disc degenerative disease of the thoracic spine. IMPRESSION: Cardiomegaly with mild, diffuse bilateral interstitial pulmonary opacity and trace pleural effusions, likely edema. No focal airspace opacity. Electronically Signed   By: Lauralyn Middleton M.D.   On: 02/05/2019 14:26    Cardiac Studies   Pending echo  Patient Profile     Robert Middleton is a 53 y.o. male with history of hypertension, strong family history of CAD, Cigar and marijuana abuse presents for evaluation of atrial fibrillation with rapid ventricular rate from [place].  Assessment & Plan    1. New onset afib RVR - Rate still elevated in 110-130s on IV cardizem 10mg /hr. BP soft low. Breathing improved. TSH normal. Pending A1c. Unable to get echo due to elevated rate.On Eliquis for anticoagulation. Increase metoprolol to 75mg  BID.   2. HTN - BP soft low on  current medication.   3.  Cigar and marijuana abuse -Advised cessation.  4. Elevated troponin  - Flat flow trend. Consistent with demand ischemia.   5. AKi - Scr stable.   For questions or updates, please contact Robert Middleton Please consult [email or web address] for contact info under        Signed, Robert Kail, PA  02/06/2019, 8:01 AM    Echo results pending rates still up increase lopressor continue iv cardizem If rate better in am and echo ok Can d/c with LA cardizem and lopressor Outpatient f/u for [facility] in 3 weeks Exam remarkable for obese black male no murmur clear lungs trace LE edema  Robert Middleton

## 2019-02-06 NOTE — Procedures (Signed)
Heart rate is too high for accurate echo at this time. 

## 2019-02-06 NOTE — ED Notes (Signed)
Ok to restart cardizem at 5mg , pt target HR will be 110-120's going forward. Pt denies pain or SOB.

## 2019-02-06 NOTE — ED Notes (Signed)
SDU  Breakfast ordered  

## 2019-02-06 NOTE — ED Notes (Signed)
Lunch tray ordered 

## 2019-02-06 NOTE — ED Notes (Signed)
Checked patient cbg it was 53 notified RN of blood sugar patient is resting with call bell in reach and family at bedside

## 2019-02-06 NOTE — Progress Notes (Signed)
Echo shows EF 20-25% Asked PA to inform Patient as I am off Will start Ace/entresto and keep in hospital Plan TEE/DCC Monday Can have cardiac CT to r/o CAD after restoration of NSR

## 2019-02-07 ENCOUNTER — Encounter (HOSPITAL_COMMUNITY): Payer: Self-pay | Admitting: *Deleted

## 2019-02-07 DIAGNOSIS — E118 Type 2 diabetes mellitus with unspecified complications: Secondary | ICD-10-CM | POA: Diagnosis not present

## 2019-02-07 DIAGNOSIS — I4819 Other persistent atrial fibrillation: Secondary | ICD-10-CM

## 2019-02-07 DIAGNOSIS — I10 Essential (primary) hypertension: Secondary | ICD-10-CM | POA: Diagnosis not present

## 2019-02-07 DIAGNOSIS — I5021 Acute systolic (congestive) heart failure: Secondary | ICD-10-CM | POA: Diagnosis not present

## 2019-02-07 DIAGNOSIS — Z720 Tobacco use: Secondary | ICD-10-CM

## 2019-02-07 LAB — BASIC METABOLIC PANEL
Anion gap: 8 (ref 5–15)
BUN: 20 mg/dL (ref 6–20)
CO2: 24 mmol/L (ref 22–32)
Calcium: 8.8 mg/dL — ABNORMAL LOW (ref 8.9–10.3)
Chloride: 107 mmol/L (ref 98–111)
Creatinine, Ser: 1.32 mg/dL — ABNORMAL HIGH (ref 0.61–1.24)
GFR calc Af Amer: >60 mL/min (ref >60)
GFR calc non Af Amer: >60 mL/min (ref >60)
Glucose, Bld: 125 mg/dL — ABNORMAL HIGH (ref 70–99)
Potassium: 3.6 mmol/L (ref 3.5–5.1)
Sodium: 139 mmol/L (ref 135–145)

## 2019-02-07 LAB — CBC
HCT: 42.3 % (ref 39.0–52.0)
Hemoglobin: 14.5 g/dL (ref 13.0–17.0)
MCH: 32.1 pg (ref 26.0–34.0)
MCHC: 34.3 g/dL (ref 30.0–36.0)
MCV: 93.6 fL (ref 80.0–100.0)
Platelets: 235 10*3/uL (ref 150–400)
RBC: 4.52 MIL/uL (ref 4.22–5.81)
RDW: 14.4 % (ref 11.5–15.5)
WBC: 12.4 10*3/uL — ABNORMAL HIGH (ref 4.0–10.5)
nRBC: 0 % (ref 0.0–0.2)

## 2019-02-07 LAB — HEMOGLOBIN A1C
Hgb A1c MFr Bld: 6.7 % — ABNORMAL HIGH (ref 4.8–5.6)
Mean Plasma Glucose: 146 mg/dL

## 2019-02-07 MED ORDER — FUROSEMIDE 10 MG/ML IJ SOLN
40.0000 mg | Freq: Once | INTRAMUSCULAR | Status: AC
  Administered 2019-02-07: 12:00:00 40 mg via INTRAVENOUS
  Filled 2019-02-07: qty 4

## 2019-02-07 MED ORDER — OFF THE BEAT BOOK
Freq: Once | Status: AC
  Administered 2019-02-07: 05:00:00
  Filled 2019-02-07: qty 1

## 2019-02-07 MED ORDER — METOPROLOL TARTRATE 50 MG PO TABS
75.0000 mg | ORAL_TABLET | Freq: Four times a day (QID) | ORAL | Status: DC
  Administered 2019-02-07 – 2019-02-08 (×7): 75 mg via ORAL
  Filled 2019-02-07 (×7): qty 1

## 2019-02-07 NOTE — Progress Notes (Addendum)
Cardiology Progress Note  Patient ID: Robert Middleton MRN: [id number] DOB: 1966/02/08 Date of Encounter: 02/07/2019  Primary Cardiologist: Charlton HawsPeter Nishan, MD  Subjective  No acute events overnight, no symptoms reported this a.m.  He does report some increased lower extremity edema.  His telemetry is shown atrial fibrillation in the low 100s.  He is remained on the diltiazem drip.  His echocardiogram shows an reduced ejection fraction 2025% which is new for him.  ROS:  All other ROS reviewed and negative. Pertinent positives noted in the HPI.     Inpatient Medications  Scheduled Meds:  apixaban  5 mg Oral BID   furosemide  40 mg Intravenous Once   metoprolol tartrate  75 mg Oral QID   Continuous Infusions:  PRN Meds: acetaminophen, bismuth subsalicylate, nitroGLYCERIN, ondansetron (ZOFRAN) IV   Vital Signs   Vitals:   02/07/19 0151 02/07/19 0251 02/07/19 0448 02/07/19 0946  BP: 104/81 (!) 116/98 121/86 104/71  Pulse:   89 (!) 106  Resp:   15   Temp:   98.4 F (36.9 C)   TempSrc:   Oral   SpO2:   97%   Weight:   117 kg   Height:        Intake/Output Summary (Last 24 hours) at 02/07/2019 1107 Last data filed at 02/07/2019 0451 Gross per 24 hour  Intake 615.59 ml  Output 200 ml  Net 415.59 ml   Last 3 Weights 02/07/2019 02/06/2019 02/05/2019  Weight (lbs) 257 lb 14.4 oz 260 lb 9.6 oz 256 lb  Weight (kg) 116.983 kg 118.207 kg 116.121 kg      Telemetry  Overnight telemetry shows atrial fibrillation with heart rate in the 90 to low 100s, which I personally reviewed.   ECG   Physical Exam   Vitals:   02/07/19 0151 02/07/19 0251 02/07/19 0448 02/07/19 0946  BP: 104/81 (!) 116/98 121/86 104/71  Pulse:   89 (!) 106  Resp:   15   Temp:   98.4 F (36.9 C)   TempSrc:   Oral   SpO2:   97%   Weight:   117 kg   Height:         Intake/Output Summary (Last 24 hours) at 02/07/2019 1107 Last data filed at 02/07/2019 0451 Gross per 24 hour  Intake 615.59 ml  Output  200 ml  Net 415.59 ml    Last 3 Weights 02/07/2019 02/06/2019 02/05/2019  Weight (lbs) 257 lb 14.4 oz 260 lb 9.6 oz 256 lb  Weight (kg) 116.983 kg 118.207 kg 116.121 kg    Body mass index is 34.98 kg/m.  General: Well nourished, well developed, in no acute distress Heart: Atraumatic, normal size  Eyes: PEERLA, EOMI  Neck: Supple, no JVD Endocrine: No thryomegaly Cardiac: Normal S1, S2; irregular rhythm; no murmurs, rubs, or gallops Lungs: Diminished breath sounds noted at the right lower lung base Abd: Soft, nontender, no hepatomegaly  Ext: 1+ lower extremity edema noted Musculoskeletal: No deformities, BUE and BLE strength normal and equal Skin: Warm and dry, no rashes   Neuro: Alert and oriented to person, place, time, and situation, CNII-XII grossly intact, no focal deficits  Psych: Normal mood and affect   Labs  High Sensitivity Troponin:   Recent Labs  Lab 02/05/19 1628 02/05/19 1830  TROPONINIHS 23* 22*     Cardiac EnzymesNo results for input(s): TROPONINI in the last 168 hours. No results for input(s): TROPIPOC in the last 168 hours.  Chemistry Recent Labs  Lab 02/05/19  1319 02/06/19 0413  NA 139 140  K 3.8 3.6  CL 110 111  CO2 19* 20*  GLUCOSE 131* 125*  BUN 21* 18  CREATININE 1.35* 1.34*  CALCIUM 8.9 8.7*  GFRNONAA >60 >60  GFRAA >60 >60  ANIONGAP 10 9    Hematology Recent Labs  Lab 02/05/19 1319 02/06/19 0413 02/07/19 0424  WBC 10.0 8.6 12.4*  RBC 4.63 4.44 4.52  HGB 14.9 14.4 14.5  HCT 44.3 41.9 42.3  MCV 95.7 94.4 93.6  MCH 32.2 32.4 32.1  MCHC 33.6 34.4 34.3  RDW 14.3 14.3 14.4  PLT 248 225 235   BNP Recent Labs  Lab 02/05/19 1443  BNP 333.0*    DDimer No results for input(s): DDIMER in the last 168 hours.   Radiology  Dg Chest 2 View  Result Date: 02/05/2019 CLINICAL DATA:  Shortness of breath EXAM: CHEST - 2 VIEW COMPARISON:  None. FINDINGS: Cardiomegaly. Mild, diffuse bilateral interstitial pulmonary opacity and trace pleural  effusions. Disc degenerative disease of the thoracic spine. IMPRESSION: Cardiomegaly with mild, diffuse bilateral interstitial pulmonary opacity and trace pleural effusions, likely edema. No focal airspace opacity. Electronically Signed   By: Lauralyn Primes M.D.   On: 02/05/2019 14:26    Cardiac Studies  TTE 02/06/2019  1. The left ventricle has severely reduced systolic function, with an ejection fraction of 20-25%. The cavity size was mildly dilated. Left ventricular diastolic function could not be evaluated secondary to atrial fibrillation. Left ventrical global  hypokinesis without regional wall motion abnormalities.  2. The right ventricle has moderately reduced systolic function. The cavity was mildly enlarged. There is no increase in right ventricular wall thickness. Right ventricular systolic pressure could not be assessed.  3. Left atrial size was moderately dilated.  4. Right atrial size was moderately dilated.  5. The aorta is not well visualized unless otherwise noted.  6. The inferior vena cava was dilated in size with <50% respiratory variability.  7. No intracardiac thrombi or masses were visualized.  Patient Profile  Robert Middleton is a 53 y.o. male with history of hypertension, family history of CAD, tobacco abuse admitted 02/05/2019 for newly diagnosed systolic heart failure with atrial fibrillation with rapid ventricular response.  Assessment & Plan   1. Atrial fibrillation -New onset with unclear etiology, could be related to newly diagnosed systolic dysfunction, or this could have preceded his systolic dysfunction.  His A1c is 6.7 TSH 3.69.  He does report a strong family history of coronary artery disease including myocardial infarction, but reports no death due to heart failure or pacemaker defibrillators in family members.  He reports he does drink alcohol but does not appear to consume enough to be concerned about alcoholic cardiomyopathy.  This could just be arrhythmia  related. -He does have evidence of volume overload on exam, we will stop his diltiazem infusion and just rate control with metoprolol 75 mg 4 times daily.  Lenient rate control will be allowed in this patient who has evidence of volume overload.  Rates in the 100-1 20 range are acceptable. -He will continue Eliquis for anticoagulation -We will proceed with a TEE cardioversion on Monday  2.  Systolic heart failure, ejection fraction 20 to 25%, with decompensation -He is a bit volume up on exam, will stop diltiazem as above, we will give him 40 mg of IV Lasix -We will pursue a beta-blocker for rate control as above and get him off the diltiazem due to the negative inotropic effect -We  will hold on any ACE inhibitors or ARB's or Entresto with diuresis -We will likely pursue a coronary CTA after the above work-up -He will inquire with his mother about any family history of heart failure  3. Hypertension  -Soft BPs in setting of systolic heart failure diagnosis  4.  Tobacco abuse -Advised to quit  5. Troponin elevation  -Non-MI troponin elevation in the setting of acute decompensated heart failure as well as atrial fibrillation with RVR  6.  Diabetes, A1c 6.7 -We will plan to start metformin upon discharge -Sliding-scale insulin house  FEN -No intravenous fluids -Daily labs -Code: Full -DVT ppx: Eliquis    For questions or updates, please contact [place] Please consult [email or web address] for contact info under        Signed, Lake Bells T. Audie Box, [place]  02/07/2019 11:07 AM

## 2019-02-08 DIAGNOSIS — I4819 Other persistent atrial fibrillation: Secondary | ICD-10-CM | POA: Diagnosis not present

## 2019-02-08 DIAGNOSIS — E118 Type 2 diabetes mellitus with unspecified complications: Secondary | ICD-10-CM | POA: Diagnosis not present

## 2019-02-08 DIAGNOSIS — I10 Essential (primary) hypertension: Secondary | ICD-10-CM | POA: Diagnosis not present

## 2019-02-08 DIAGNOSIS — I5021 Acute systolic (congestive) heart failure: Secondary | ICD-10-CM | POA: Diagnosis not present

## 2019-02-08 LAB — CBC
HCT: 42.6 % (ref 39.0–52.0)
Hemoglobin: 14.7 g/dL (ref 13.0–17.0)
MCH: 32.2 pg (ref 26.0–34.0)
MCHC: 34.5 g/dL (ref 30.0–36.0)
MCV: 93.4 fL (ref 80.0–100.0)
Platelets: 236 10*3/uL (ref 150–400)
RBC: 4.56 MIL/uL (ref 4.22–5.81)
RDW: 14.2 % (ref 11.5–15.5)
WBC: 11.2 10*3/uL — ABNORMAL HIGH (ref 4.0–10.5)
nRBC: 0 % (ref 0.0–0.2)

## 2019-02-08 LAB — BASIC METABOLIC PANEL
Anion gap: 10 (ref 5–15)
BUN: 19 mg/dL (ref 6–20)
CO2: 19 mmol/L — ABNORMAL LOW (ref 22–32)
Calcium: 8.4 mg/dL — ABNORMAL LOW (ref 8.9–10.3)
Chloride: 108 mmol/L (ref 98–111)
Creatinine, Ser: 1.23 mg/dL (ref 0.61–1.24)
GFR calc Af Amer: >60 mL/min (ref >60)
GFR calc non Af Amer: >60 mL/min (ref >60)
Glucose, Bld: 109 mg/dL — ABNORMAL HIGH (ref 70–99)
Potassium: 3.5 mmol/L (ref 3.5–5.1)
Sodium: 137 mmol/L (ref 135–145)

## 2019-02-08 LAB — MAGNESIUM: Magnesium: 1.8 mg/dL (ref 1.7–2.4)

## 2019-02-08 MED ORDER — POTASSIUM CHLORIDE 20 MEQ PO PACK
40.0000 meq | PACK | ORAL | Status: DC
  Administered 2019-02-08: 11:00:00 40 meq via ORAL
  Filled 2019-02-08 (×2): qty 2

## 2019-02-08 MED ORDER — SACUBITRIL-VALSARTAN 24-26 MG PO TABS
1.0000 | ORAL_TABLET | Freq: Two times a day (BID) | ORAL | Status: DC
  Administered 2019-02-08 – 2019-02-09 (×3): 1 via ORAL
  Filled 2019-02-08 (×3): qty 1

## 2019-02-08 MED ORDER — SODIUM CHLORIDE 0.9% FLUSH: 3.0000 mL | INTRAVENOUS | Status: DC | PRN

## 2019-02-08 MED ORDER — MAGNESIUM OXIDE 400 (241.3 MG) MG PO TABS
800.0000 mg | ORAL_TABLET | Freq: Once | ORAL | Status: AC
  Administered 2019-02-08: 800 mg via ORAL
  Filled 2019-02-08: qty 2

## 2019-02-08 MED ORDER — SODIUM CHLORIDE 0.9 % IV SOLN
INTRAVENOUS | Status: DC
  Administered 2019-02-09: 08:00:00 via INTRAVENOUS

## 2019-02-08 MED ORDER — POTASSIUM CHLORIDE CRYS ER 20 MEQ PO TBCR
40.0000 meq | EXTENDED_RELEASE_TABLET | ORAL | Status: AC
  Administered 2019-02-08: 40 meq via ORAL
  Filled 2019-02-08: qty 2

## 2019-02-08 MED ORDER — SODIUM CHLORIDE 0.9% FLUSH
3.0000 mL | Freq: Two times a day (BID) | INTRAVENOUS | Status: DC
  Administered 2019-02-08 – 2019-02-09 (×3): 3 mL via INTRAVENOUS

## 2019-02-08 MED ORDER — SODIUM CHLORIDE 0.9 % IV SOLN: 250.0000 mL | INTRAVENOUS | Status: DC

## 2019-02-08 NOTE — Progress Notes (Addendum)
Cardiology Progress Note  Patient ID: Robert Middleton MRN: [id number] DOB: 16-Jan-1966 Date of Encounter: 02/08/2019  Primary Cardiologist: Charlton Haws, MD  Subjective  Net -700 cc overnight.  Lower extreme edema improved.  Heart rates in the 110s to 130 range.  He denies any symptoms today.  ROS:  All other ROS reviewed and negative. Pertinent positives noted in the HPI.     Inpatient Medications  Scheduled Meds: . apixaban  5 mg Oral BID  . magnesium oxide  800 mg Oral Once  . metoprolol tartrate  75 mg Oral QID  . potassium chloride  40 mEq Oral Q4H   Continuous Infusions:  PRN Meds: acetaminophen, bismuth subsalicylate, nitroGLYCERIN, ondansetron (ZOFRAN) IV   Vital Signs   Vitals:   02/07/19 1954 02/07/19 2109 02/08/19 0414 02/08/19 0940  BP: 102/89 117/89 (!) 120/103 (!) 115/95  Pulse: 97 (!) 122 90 (!) 125  Resp: 18  18   Temp: 99 F (37.2 C)  98.7 F (37.1 C)   TempSrc: Oral  Oral   SpO2: 100%  92%   Weight:      Height:        Intake/Output Summary (Last 24 hours) at 02/08/2019 1041 Last data filed at 02/08/2019 0900 Gross per 24 hour  Intake 918 ml  Output 1550 ml  Net -632 ml   Last 3 Weights 02/07/2019 02/06/2019 02/05/2019  Weight (lbs) 257 lb 14.4 oz 260 lb 9.6 oz 256 lb  Weight (kg) 116.983 kg 118.207 kg 116.121 kg      Telemetry  Overnight telemetry shows atrial fibrillation with heart rate in the 110-120 range frequent PVCs noted, which I personally reviewed.    Physical Exam   Vitals:   02/07/19 1954 02/07/19 2109 02/08/19 0414 02/08/19 0940  BP: 102/89 117/89 (!) 120/103 (!) 115/95  Pulse: 97 (!) 122 90 (!) 125  Resp: 18  18   Temp: 99 F (37.2 C)  98.7 F (37.1 C)   TempSrc: Oral  Oral   SpO2: 100%  92%   Weight:      Height:         Intake/Output Summary (Last 24 hours) at 02/08/2019 1041 Last data filed at 02/08/2019 0900 Gross per 24 hour  Intake 918 ml  Output 1550 ml  Net -632 ml    Last 3 Weights 02/07/2019 02/06/2019  02/05/2019  Weight (lbs) 257 lb 14.4 oz 260 lb 9.6 oz 256 lb  Weight (kg) 116.983 kg 118.207 kg 116.121 kg    Body mass index is 34.98 kg/m.  General: Well nourished, well developed, in no acute distress Heart: Atraumatic, normal size  Eyes: PEERLA, EOMI  Neck: Supple, no JVD Endocrine: No thryomegaly Cardiac: Normal S1, S2; RRR; no murmurs, rubs, or gallops Lungs: Clear to auscultation bilaterally, no wheezing, rhonchi or rales  Abd: Soft, nontender, no hepatomegaly  Ext: No edema, pulses 2+ Musculoskeletal: No deformities, BUE and BLE strength normal and equal Skin: Warm and dry, no rashes   Neuro: Alert and oriented to person, place, time, and situation, CNII-XII grossly intact, no focal deficits  Psych: Normal mood and affect   Labs  High Sensitivity Troponin:   Recent Labs  Lab 02/05/19 1628 02/05/19 1830  TROPONINIHS 23* 22*     Cardiac EnzymesNo results for input(s): TROPONINI in the last 168 hours. No results for input(s): TROPIPOC in the last 168 hours.  Chemistry Recent Labs  Lab 02/06/19 0413 02/07/19 1305 02/08/19 0404  NA 140 139 137  K  3.6 3.6 3.5  CL 111 107 108  CO2 20* 24 19*  GLUCOSE 125* 125* 109*  BUN 18 20 19   CREATININE 1.34* 1.32* 1.23  CALCIUM 8.7* 8.8* 8.4*  GFRNONAA >60 >60 >60  GFRAA >60 >60 >60  ANIONGAP 9 8 10     Hematology Recent Labs  Lab 02/06/19 0413 02/07/19 0424 02/08/19 0404  WBC 8.6 12.4* 11.2*  RBC 4.44 4.52 4.56  HGB 14.4 14.5 14.7  HCT 41.9 42.3 42.6  MCV 94.4 93.6 93.4  MCH 32.4 32.1 32.2  MCHC 34.4 34.3 34.5  RDW 14.3 14.4 14.2  PLT 225 235 236   BNP Recent Labs  Lab 02/05/19 1443  BNP 333.0*    DDimer No results for input(s): DDIMER in the last 168 hours.   Radiology  No results found.  Cardiac Studies  TTE 02/06/2019 1. The left ventricle has severely reduced systolic function, with an ejection fraction of 20-25%. The cavity size was mildly dilated. Left ventricular diastolic function could not be  evaluated secondary to atrial fibrillation. Left ventrical global  hypokinesis without regional wall motion abnormalities. 2. The right ventricle has moderately reduced systolic function. The cavity was mildly enlarged. There is no increase in right ventricular wall thickness. Right ventricular systolic pressure could not be assessed. 3. Left atrial size was moderately dilated. 4. Right atrial size was moderately dilated. 5. The aorta is not well visualized unless otherwise noted. 6. The inferior vena cava was dilated in size with <50% respiratory variability. 7. No intracardiac thrombi or masses were visualized.  Patient Profile  Robert Middleton is a 53 y.o. male with history of hypertension, family history of CAD, tobacco abuse admitted 02/05/2019 for newly diagnosed systolic heart failure with atrial fibrillation with rapid ventricular response.  Assessment & Plan  1. Atrial fibrillation -New onset with unclear etiology, could be related to newly diagnosed systolic dysfunction, or this could have preceded his systolic dysfunction.  His A1c is 6.7 TSH 3.69.  He does report a strong family history of coronary artery disease including myocardial infarction, but reports no death due to heart failure or pacemaker defibrillators in family members.  He reports he does drink alcohol but does not appear to consume enough to be concerned about alcoholic cardiomyopathy.  This could just be arrhythmia related. -We will continue with lenient rate control for now with metoprolol tartrate 75 mg 4 times daily -He will remain on Eliquis -N.p.o. at midnight for TEE cardioversion  2.  Systolic heart failure, ejection fraction 20 to 25%, with decompensation -Euvolemic on examination -Unclear etiology at this time, as this may have resulted from his atrial fibrillation or atrial fibrillation with systolic heart failure -For now we will plan to get him back in sinus rhythm tomorrow n.p.o. at midnight for  TEE cardioversion -We will likely pursue a coronary CTA after this -I will go ahead and start him on Entresto today  3. Hypertension  -BP acceptable, but we will start Entresto today and titrate up  4.  Tobacco abuse -Advised to quit  5. Troponin elevation  -Non-MI troponin elevation in the setting of acute decompensated heart failure as well as atrial fibrillation with RVR  6.  Diabetes, A1c 6.7 -We will plan to start metformin upon discharge -Sliding-scale insulin house  7.  Hypokalemia/PVCs -I will replete his potassium today and check a magnesium -We will go ahead and give magnesium as well -Frequent PVCs likely due to electrolyte normalities  FEN -No intravenous fluids -Daily labs -Code:  Full -DVT ppx: Eliquis    For questions or updates, please contact [facility] Please consult [email or web address] for contact info under        Signed, Lake Bells T. Audie Box, [place]  02/08/2019 10:41 AM

## 2019-02-08 NOTE — Progress Notes (Signed)
Pt HR has been slightly up in 120-130s at rest this afternoon.  HR goes up to 140s while talking.  Pt demonstrated bearing down with a little effect.  [facility] on call Dr. Hassell Done made aware. Received order to continue current regimen.  Idolina Primer, RN

## 2019-02-08 NOTE — Progress Notes (Signed)
    [facility] has been requested to perform a transesophageal echocardiogram on Robert Middleton for Afib.  After careful review of history and examination, the risks and benefits of transesophageal echocardiogram have been explained including risks of esophageal damage, perforation (1:10,000 risk), bleeding, pharyngeal hematoma as well as other potential complications associated with conscious sedation including aspiration, arrhythmia, respiratory failure and death. Alternatives to treatment were discussed, questions were answered. Patient is willing to proceed.   Pt is scheduled for TEE/DCCV tomorrow 02/09/19 with Dr. Johnsie Cancel at 1400. NPO at MN.   Tami Lin Suezette Lafave, [place]  02/08/2019 2:45 PM

## 2019-02-08 NOTE — Progress Notes (Signed)
Dr. Hassell Done made aware.  Received order to continue current regimen.

## 2019-02-09 ENCOUNTER — Inpatient Hospital Stay (HOSPITAL_COMMUNITY): Payer: No Typology Code available for payment source

## 2019-02-09 ENCOUNTER — Inpatient Hospital Stay (HOSPITAL_COMMUNITY): Payer: Self-pay | Admitting: Certified Registered"

## 2019-02-09 ENCOUNTER — Other Ambulatory Visit: Payer: Self-pay | Admitting: Physician Assistant

## 2019-02-09 ENCOUNTER — Encounter (HOSPITAL_COMMUNITY)
Admission: EM | Disposition: A | Discharge status: Home / Self Care | Payer: Self-pay | Source: Home / Self Care | Attending: Cardiovascular Disease

## 2019-02-09 ENCOUNTER — Encounter (HOSPITAL_COMMUNITY): Payer: Self-pay | Admitting: Emergency Medicine

## 2019-02-09 DIAGNOSIS — I5021 Acute systolic (congestive) heart failure: Secondary | ICD-10-CM | POA: Diagnosis not present

## 2019-02-09 DIAGNOSIS — I10 Essential (primary) hypertension: Secondary | ICD-10-CM | POA: Diagnosis not present

## 2019-02-09 DIAGNOSIS — I4891 Unspecified atrial fibrillation: Secondary | ICD-10-CM | POA: Diagnosis not present

## 2019-02-09 DIAGNOSIS — I340 Nonrheumatic mitral (valve) insufficiency: Secondary | ICD-10-CM

## 2019-02-09 DIAGNOSIS — I4819 Other persistent atrial fibrillation: Secondary | ICD-10-CM | POA: Diagnosis not present

## 2019-02-09 DIAGNOSIS — E118 Type 2 diabetes mellitus with unspecified complications: Secondary | ICD-10-CM | POA: Diagnosis not present

## 2019-02-09 HISTORY — PX: TEE WITHOUT CARDIOVERSION: SHX5443

## 2019-02-09 HISTORY — PX: CARDIOVERSION: SHX1299

## 2019-02-09 LAB — BASIC METABOLIC PANEL
Anion gap: 9 (ref 5–15)
BUN: 15 mg/dL (ref 6–20)
CO2: 23 mmol/L (ref 22–32)
Calcium: 8.2 mg/dL — ABNORMAL LOW (ref 8.9–10.3)
Chloride: 106 mmol/L (ref 98–111)
Creatinine, Ser: 1.31 mg/dL — ABNORMAL HIGH (ref 0.61–1.24)
GFR calc Af Amer: >60 mL/min (ref >60)
GFR calc non Af Amer: >60 mL/min (ref >60)
Glucose, Bld: 100 mg/dL — ABNORMAL HIGH (ref 70–99)
Potassium: 3.8 mmol/L (ref 3.5–5.1)
Sodium: 138 mmol/L (ref 135–145)

## 2019-02-09 LAB — CBC WITH DIFFERENTIAL/PLATELET
Abs Immature Granulocytes: 0.02 10*3/uL (ref 0.00–0.07)
Basophils Absolute: 0.1 10*3/uL (ref 0.0–0.1)
Basophils Relative: 1 %
Eosinophils Absolute: 0.2 10*3/uL (ref 0.0–0.5)
Eosinophils Relative: 1 %
HCT: 44.8 % (ref 39.0–52.0)
Hemoglobin: 15.3 g/dL (ref 13.0–17.0)
Immature Granulocytes: 0 %
Lymphocytes Relative: 22 %
Lymphs Abs: 2.4 10*3/uL (ref 0.7–4.0)
MCH: 32.3 pg (ref 26.0–34.0)
MCHC: 34.2 g/dL (ref 30.0–36.0)
MCV: 94.5 fL (ref 80.0–100.0)
Monocytes Absolute: 1.4 10*3/uL — ABNORMAL HIGH (ref 0.1–1.0)
Monocytes Relative: 13 %
Neutro Abs: 6.8 10*3/uL (ref 1.7–7.7)
Neutrophils Relative %: 63 %
Platelets: 259 10*3/uL (ref 150–400)
RBC: 4.74 MIL/uL (ref 4.22–5.81)
RDW: 13.8 % (ref 11.5–15.5)
WBC: 10.9 10*3/uL — ABNORMAL HIGH (ref 4.0–10.5)
nRBC: 0 % (ref 0.0–0.2)

## 2019-02-09 SURGERY — ECHOCARDIOGRAM, TRANSESOPHAGEAL
Anesthesia: General

## 2019-02-09 MED ORDER — SPIRONOLACTONE 25 MG PO TABS: 12.5000 mg | ORAL_TABLET | Freq: Every day | ORAL | 3 refills | Status: DC

## 2019-02-09 MED ORDER — METFORMIN HCL ER 500 MG PO TB24: 500.0000 mg | ORAL_TABLET | Freq: Every day | ORAL | 3 refills | Status: DC

## 2019-02-09 MED ORDER — METOPROLOL SUCCINATE ER 100 MG PO TB24: 100.0000 mg | ORAL_TABLET | Freq: Every day | ORAL | 3 refills | Status: DC

## 2019-02-09 MED ORDER — METFORMIN HCL 500 MG PO TABS: 500.0000 mg | ORAL_TABLET | Freq: Every day | ORAL | Status: DC

## 2019-02-09 MED ORDER — PROPOFOL 10 MG/ML IV BOLUS
INTRAVENOUS | Status: DC | PRN
  Administered 2019-02-09: 170 mg via INTRAVENOUS

## 2019-02-09 MED ORDER — SPIRONOLACTONE 12.5 MG HALF TABLET
12.5000 mg | ORAL_TABLET | Freq: Every day | ORAL | Status: DC
  Filled 2019-02-09: qty 1

## 2019-02-09 MED ORDER — METOPROLOL SUCCINATE ER 100 MG PO TB24
200.0000 mg | ORAL_TABLET | Freq: Every day | ORAL | Status: DC
  Administered 2019-02-09: 08:00:00 200 mg via ORAL
  Filled 2019-02-09: qty 2

## 2019-02-09 MED ORDER — SACUBITRIL-VALSARTAN 24-26 MG PO TABS: 1.0000 | ORAL_TABLET | Freq: Two times a day (BID) | ORAL | 3 refills | Status: DC

## 2019-02-09 MED ORDER — APIXABAN 5 MG PO TABS: 5.0000 mg | ORAL_TABLET | Freq: Two times a day (BID) | ORAL | 3 refills | Status: DC

## 2019-02-09 MED ORDER — NITROGLYCERIN 0.4 MG SL SUBL: 0.4000 mg | SUBLINGUAL_TABLET | SUBLINGUAL | 3 refills | Status: AC | PRN

## 2019-02-09 MED ORDER — LIVING WELL WITH DIABETES BOOK
Freq: Once | Status: DC
  Filled 2019-02-09: qty 1

## 2019-02-09 MED ORDER — METOPROLOL SUCCINATE ER 100 MG PO TB24: 100.0000 mg | ORAL_TABLET | Freq: Every day | ORAL | Status: DC

## 2019-02-09 MED FILL — NITROGLYCERIN 0.4 MG TAB SL: 0.4 | 8 days supply | Qty: 25 | Fill #0

## 2019-02-09 MED FILL — metFORMIN HCL 500 MG TABS: 500 | 60 days supply | Qty: 60 | Fill #0

## 2019-02-09 MED FILL — METOPROLOL SUCCINATE ER 100: 100 | 90 days supply | Qty: 90 | Fill #0

## 2019-02-09 MED FILL — ENTRESTO 24 MG-26 MG TABLET: 24-26 | 30 days supply | Qty: 60 | Fill #0

## 2019-02-09 MED FILL — SPIRONOLACTONE 25 MG TABLET: 25 | 60 days supply | Qty: 30 | Fill #0

## 2019-02-09 MED FILL — ELIQUIS 5 MG TABLET: 5 | 30 days supply | Qty: 60 | Fill #0

## 2019-02-09 NOTE — Transfer of Care (Signed)
Immediate Anesthesia Transfer of Care Note  Patient: Robert Middleton  Procedure(s) Performed: TRANSESOPHAGEAL ECHOCARDIOGRAM (TEE) (N/A ) CARDIOVERSION (N/A )  Patient Location: Endoscopy Unit  Anesthesia Type:General  Level of Consciousness: drowsy and patient cooperative  Airway & Oxygen Therapy: Patient Spontanous Breathing  Post-op Assessment: Report given to RN and Post -op Vital signs reviewed and stable  Post vital signs: Reviewed and stable  Last Vitals:  Vitals Value Taken Time  BP    Temp 36.6 C 02/09/19 1310  Pulse 63 02/09/19 1318  Resp 21 02/09/19 1318  SpO2 87 % 02/09/19 1318  Vitals shown include unvalidated device data.  Last Pain:  Vitals:   02/09/19 1310  TempSrc: Temporal  PainSc: 0-No pain      Patients Stated Pain Goal: 0 (33/83/29 1916)  Complications: No apparent anesthesia complications

## 2019-02-09 NOTE — Progress Notes (Signed)
Cardiology Progress Note  Patient ID: Robert Middleton MRN: [id number] DOB: [birth year] Date of Encounter: 02/09/2019  Primary Cardiologist: Robert HawsPeter Nishan, MD  Subjective  No complaints this morning.  Heart rate noted to be in the 100-110 range.  He just got his morning dose of metoprolol as I was seeing him.  N.p.o. for TEE cardioversion.  ROS:  All other ROS reviewed and negative. Pertinent positives noted in the HPI.     Inpatient Medications  Scheduled Meds: . apixaban  5 mg Oral BID  . metoprolol succinate  200 mg Oral Daily  . sacubitril-valsartan  1 tablet Oral BID  . sodium chloride flush  3 mL Intravenous Q12H   Continuous Infusions: . sodium chloride 20 mL/hr at 02/09/19 0809  . sodium chloride     PRN Meds: acetaminophen, bismuth subsalicylate, nitroGLYCERIN, ondansetron (ZOFRAN) IV, sodium chloride flush   Vital Signs   Vitals:   02/08/19 1740 02/08/19 2203 02/09/19 0449 02/09/19 0456  BP: 113/89 113/80 112/71   Pulse:  (!) 115 (!) 119   Resp:  (!) 27 (!) 21   Temp:  98 F (36.7 C) 98.6 F (37 C)   TempSrc:  Oral Oral   SpO2:  97% 98%   Weight:    114.4 kg  Height:        Intake/Output Summary (Last 24 hours) at 02/09/2019 0829 Last data filed at 02/09/2019 0800 Gross per 24 hour  Intake 446 ml  Output 1360 ml  Net -914 ml   Last 3 Weights 02/09/2019 02/07/2019 02/06/2019  Weight (lbs) 252 lb 3.2 oz 257 lb 14.4 oz 260 lb 9.6 oz  Weight (kg) 114.397 kg 116.983 kg 118.207 kg      Telemetry  Overnight telemetry shows atrial fibrillation with rates in the low 100 up to 130 range, frequent PVCs noted, which I personally reviewed.   Physical Exam   Vitals:   02/08/19 1740 02/08/19 2203 02/09/19 0449 02/09/19 0456  BP: 113/89 113/80 112/71   Pulse:  (!) 115 (!) 119   Resp:  (!) 27 (!) 21   Temp:  98 F (36.7 C) 98.6 F (37 C)   TempSrc:  Oral Oral   SpO2:  97% 98%   Weight:    114.4 kg  Height:         Intake/Output Summary (Last 24 hours) at  02/09/2019 0829 Last data filed at 02/09/2019 0800 Gross per 24 hour  Intake 446 ml  Output 1360 ml  Net -914 ml    Last 3 Weights 02/09/2019 02/07/2019 02/06/2019  Weight (lbs) 252 lb 3.2 oz 257 lb 14.4 oz 260 lb 9.6 oz  Weight (kg) 114.397 kg 116.983 kg 118.207 kg    Body mass index is 34.2 kg/m.  General: Well nourished, well developed, in no acute distress Heart: Atraumatic, normal size  Eyes: PEERLA, EOMI  Neck: Supple, no JVD Endocrine: No thryomegaly Cardiac: Normal S1, S2; irregular rhythm, no murmurs rubs or gallops Lungs: Clear to auscultation bilaterally, no wheezing, rhonchi or rales  Abd: Soft, nontender, no hepatomegaly  Ext: No edema, pulses 2+ Musculoskeletal: No deformities, BUE and BLE strength normal and equal Skin: Warm and dry, no rashes   Neuro: Alert and oriented to person, place, time, and situation, CNII-XII grossly intact, no focal deficits  Psych: Normal mood and affect   Labs  High Sensitivity Troponin:   Recent Labs  Lab 02/05/19 1628 02/05/19 1830  TROPONINIHS 23* 22*     Cardiac EnzymesNo results for  input(s): TROPONINI in the last 168 hours. No results for input(s): TROPIPOC in the last 168 hours.  Chemistry Recent Labs  Lab 02/06/19 0413 02/07/19 1305 02/08/19 0404  NA 140 139 137  K 3.6 3.6 3.5  CL 111 107 108  CO2 20* 24 19*  GLUCOSE 125* 125* 109*  BUN 18 20 19   CREATININE 1.34* 1.32* 1.23  CALCIUM 8.7* 8.8* 8.4*  GFRNONAA >60 >60 >60  GFRAA >60 >60 >60  ANIONGAP 9 8 10     Hematology Recent Labs  Lab 02/06/19 0413 02/07/19 0424 02/08/19 0404  WBC 8.6 12.4* 11.2*  RBC 4.44 4.52 4.56  HGB 14.4 14.5 14.7  HCT 41.9 42.3 42.6  MCV 94.4 93.6 93.4  MCH 32.4 32.1 32.2  MCHC 34.4 34.3 34.5  RDW 14.3 14.4 14.2  PLT 225 235 236   BNP Recent Labs  Lab 02/05/19 1443  BNP 333.0*    DDimer No results for input(s): DDIMER in the last 168 hours.   Radiology  No results found.  Cardiac Studies  02/06/2019  1. The left  ventricle has severely reduced systolic function, with an ejection fraction of 20-25%. The cavity size was mildly dilated. Left ventricular diastolic function could not be evaluated secondary to atrial fibrillation. Left ventrical global  hypokinesis without regional wall motion abnormalities.  2. The right ventricle has moderately reduced systolic function. The cavity was mildly enlarged. There is no increase in right ventricular wall thickness. Right ventricular systolic pressure could not be assessed.  3. Left atrial size was moderately dilated.  4. Right atrial size was moderately dilated.  5. The aorta is not well visualized unless otherwise noted.  6. The inferior vena cava was dilated in size with <50% respiratory variability.  7. No intracardiac thrombi or masses were visualized.  Patient Profile  Robert Middleton a 53 y.o.malewith history of hypertension, family history of CAD, tobacco abuse admitted 02/05/2019 for newly diagnosed systolic heart failure with atrial fibrillation with rapid ventricular response.  Assessment & Plan  1. Atrialfibrillation -New onset with unclear etiology, could be related to newly diagnosed systolic dysfunction, or this could have preceded his systolic dysfunction. His A1c is 6.7 TSH 3.69. He does report a strong family history of coronary artery disease including myocardial infarction, but reports no death due to heart failure or pacemaker defibrillators in family members. He reports he does drink alcohol but does not appear to consume enough to be concerned about alcoholic cardiomyopathy. This could just be arrhythmia related. -I have increased his metoprolol to 200 mg succinate once daily, hopefully he will get cardioverted today, we will allow for lenient rate control due to his low ejection fraction and soft BPs -He will remain on Eliquis -N.p.o. at midnight for TEE cardioversion  2.Systolic heart failure, ejection fraction 20 to 25%, with  decompensation -Euvolemic on examination -Unclear etiology at this time, as this may have resulted from his atrial fibrillation or atrial fibrillation with systolic heart failure -For now we will plan to get him back in sinus rhythm tomorrow n.p.o. at midnight for TEE cardioversion -Blood pressure is a bit soft 99/60 with Sherryll Burger, we will continue this for now   3. Hypertension  -He will continue metoprolol and Entresto  4.Tobacco abuse -Advised to quit  5. Troponin elevation  -Non-MI troponin elevation in the setting of acute decompensated heart failure as well as atrial fibrillation with RVR  6.Diabetes, A1c 6.7 -We will plan to start metformin upon discharge -Sliding-scale insulin house  7.  Hypokalemia/PVCs -Aggressive electrolyte repletion  FEN -No intravenous fluids -Daily labs -Code: Full -DVT [id number]    For questions or updates, please contact [facility] Please consult [email or web address] for contact info under        Signed, Lake Bells T. Audie Box, [place]  02/09/2019 8:29 AM

## 2019-02-09 NOTE — Discharge Summary (Signed)
Discharge Summary    Patient ID: CURBY CARSWELL MRN: [id number]; DOB: 01-20-1966  Admit date: 02/05/2019 Discharge date: 02/09/2019  Primary Care Provider: Lurline Hare, MD  Primary Cardiologist: [name], MD  Primary Electrophysiologist:  None   Discharge Diagnoses    Principal Problem:   Atrial fibrillation with RVR [facility]) Active Problems:   Atrial fibrillation ([place])   Acute systolic heart failure ([place])   Tobacco abuse   HTN (hypertension)   Type 2 diabetes mellitus with complication, without long-term current use of insulin (HCC)   Allergies No Known Allergies  Diagnostic Studies/Procedures    TEE/DCCV 02/09/19: IMPRESSIONS  1. The left ventricle has severely reduced systolic function, with an ejection fraction of 20-25%. The cavity size was severely dilated. Left ventricular diastolic function could not be evaluated. Left ventrical global hypokinesis without regional wall  motion abnormalities.  2. The right ventricle has normal systolic function. The cavity was normal. There is no increase in right ventricular wall thickness.  3. Left atrial size was moderately dilated.  4. No LAA thrombus          DCC done post TEE     On eliquis     Shock x 1 150 J biphasic     Converted from afib rate 120-140 bpm     to NSR rates 55-60 bpm     No immediated neuroligc sequelae.  5. Right atrial size was moderately dilated.  6. The aortic valve is tricuspid Mild thickening of the aortic valve.  7. The aortic root is normal in size and structure.    Echo 02/06/19:  1. The left ventricle has severely reduced systolic function, with an ejection fraction of 20-25%. The cavity size was mildly dilated. Left ventricular diastolic function could not be evaluated secondary to atrial fibrillation. Left ventrical global  hypokinesis without regional wall motion abnormalities.  2. The right ventricle has moderately reduced systolic function. The cavity was mildly enlarged.  There is no increase in right ventricular wall thickness. Right ventricular systolic pressure could not be assessed.  3. Left atrial size was moderately dilated.  4. Right atrial size was moderately dilated.  5. The aorta is not well visualized unless otherwise noted.  6. The inferior vena cava was dilated in size with <50% respiratory variability.  7. No intracardiac thrombi or masses were visualized.  History of Present Illness     Robert Middleton is a 53 y.o. male with history of hypertension and strong family history of CAD presents for evaluation of atrial fibrillation with rapid ventricular rate from [place].  He was diagnosed with hypertension February 2020.  He takes amlodipine 5 mg daily.  He smokes cigar and marijuana multiple days per week, but not every day.  He drinks 1 pint per week.  He works as a Biomedical scientist person.  He has a strong family history of CAD: Father had MI at age 56 requiring CABG.  Died of cardiac complication at age 59. Elder brother had MI at age 29. Brother has diabetes.  He presented to [facility] on [facility]/05[facility]19 with chest discomfort that started on 9/3. He felt fatigued and tired. He started feeling intermittent palpitations and SOB on 02/02/19.  He reported constant palpitation for past 2 days.  He has associated shortness of breath.  No chest pain, orthopnea, dizziness, PND, syncope, lower extremity edema or melena.  Compliant with low-sodium diet and his medication.  He went to see his provider at [facility] and noted to be in atrial  fibrillation with rapid ventricular rate and sent to ER.  Heart rate sustaining in 150s.  He was started on IV Cardizem and IV heparin.  Blood pressure 128/97.  BNP 333.  Glucose 131.  Creatinine 1.35.  Chest x-ray with pulmonary edema likely.  Hospital Course     Consultants: none  Atrial fibrillation with RVR Pt was admitted to cardiology. Echocardiogram revealed new EF of 20-25%. Unfortunately, he continued in RVR in the 110-130s on IV  cardizem with marginal BP. He was started on eliquis in anticipation for cardioversion. TSH was normal. Started high dose toprol - 200 mg. He underwent successful TEE-guided cardioversion 02/09/19 with conversion to sinus rhythm with rates in the 50-60s. Toprol was decreased to 100 mg daily. He ambulated in the halls with adequate heart rate response and BP at discharge was 131/82. He was discharged on eliquis 5 mg BID. Will need at least 4 weeks of uninterrupted anticoagulation post cardioversion. Given his CHA2DS2-VASc Score and unadjusted Ischemic Stroke Rate (% per year) is equal to 2.2 % stroke rate/year from a score of 2 (CHF, HTN), will need long term AC.    New onset systolic heart failure Hypertension EF estimated at 20-25%. Unclear etiology: RVR vs CAD. He does mention alcohol use, but doesn't report heavy drinking.  Restoration of NSR this admission with cardioversion. Will refer to advanced heart failure clinic for further management. Started low dose entresto and 12/5 mg spironolactone this admission. Appeared euvolemic on exam. Will need labs next week. sCr on discharge was 1.31. D/C'ed home norvasc. Long discussion about low sodium diet, daily weights, and BP log including HR.   Elevated hs troponin HS troponin likely related to demand ischemia in the setting of decompensated CHF and RVR. Given his risk factors and strong family history of heart disease, plan for CT coronary to evaluate CAD. This can be done outpatient.   Diabetes - new diagnosis A1c was 6.7% this admission. Started metformin - 500 mg XR. Will need to follow up with his PCP.   Case discussed with Dr. Flora Lipps for the above medication changes. Pt seen and examined by Dr. Flora Lipps and deemed stable for discharge. Follow up will be made with Dr. Eden Emms and AHF team (messages sent to both).    _____________  Discharge Vitals Blood pressure 131/82, pulse 60, temperature 97.9 F (36.6 C), temperature source Temporal,  resp. rate (!) 21, height 6' (1.829 m), weight 114.4 kg, SpO2 99 %.  Filed Weights   02/07/19 0448 02/09/19 0456 02/09/19 1209  Weight: 117 kg 114.4 kg 114.4 kg    Labs & Radiologic Studies    CBC Recent Labs    02/08/19 0404 02/09/19 0734  WBC 11.2* 10.9*  NEUTROABS  --  6.8  HGB 14.7 15.3  HCT 42.6 44.8  MCV 93.4 94.5  PLT 236 259   Basic Metabolic Panel Recent Labs    21/19/41 0404 02/09/19 0734  NA 137 138  K 3.5 3.8  CL 108 106  CO2 19* 23  GLUCOSE 109* 100*  BUN 19 15  CREATININE 1.23 1.31*  CALCIUM 8.4* 8.2*  MG 1.8  --    Liver Function Tests No results for input(s): AST, ALT, ALKPHOS, BILITOT, PROT, ALBUMIN in the last 72 hours. No results for input(s): LIPASE, AMYLASE in the last 72 hours. High Sensitivity Troponin:   Recent Labs  Lab 02/05/19 1628 02/05/19 1830  TROPONINIHS 23* 22*    BNP Invalid input(s): POCBNP D-Dimer No results for input(s): DDIMER in  the last 72 hours. Hemoglobin A1C No results for input(s): HGBA1C in the last 72 hours. Fasting Lipid Panel No results for input(s): CHOL, HDL, LDLCALC, TRIG, CHOLHDL, LDLDIRECT in the last 72 hours. Thyroid Function Tests No results for input(s): TSH, T4TOTAL, T3FREE, THYROIDAB in the last 72 hours.  Invalid input(s): FREET3 _____________  Dg Chest 2 View  Result Date: 02/05/2019 CLINICAL DATA:  Shortness of breath EXAM: CHEST - 2 VIEW COMPARISON:  None. FINDINGS: Cardiomegaly. Mild, diffuse bilateral interstitial pulmonary opacity and trace pleural effusions. Disc degenerative disease of the thoracic spine. IMPRESSION: Cardiomegaly with mild, diffuse bilateral interstitial pulmonary opacity and trace pleural effusions, likely edema. No focal airspace opacity. Electronically Signed   By: Lauralyn PrimesAlex  Bibbey M.D.   On: 02/05/2019 14:26   Disposition   Pt is being discharged home today in good condition.  Follow-up Plans & Appointments     Discharge Instructions    Diet - low sodium heart  healthy   Complete by: As directed    Increase activity slowly   Complete by: As directed       Discharge Medications   Allergies as of 02/09/2019   No Known Allergies     Medication List    STOP taking these medications   amLODipine 5 MG tablet Commonly known as: NORVASC   ibuprofen 200 MG tablet Commonly known as: ADVIL     TAKE these medications   apixaban 5 MG Tabs tablet Commonly known as: ELIQUIS Take 1 tablet (5 mg total) by mouth 2 (two) times daily.   BLACK ELDERBERRY PO Take 1 tablet by mouth daily. W/ cloves and honey   metFORMIN 500 MG 24 hr tablet Commonly known as: GLUCOPHAGE-XR Take 1 tablet (500 mg total) by mouth daily with breakfast. Start taking on: February 10, 2019   metoprolol succinate 100 MG 24 hr tablet Commonly known as: TOPROL-XL Take 1 tablet (100 mg total) by mouth at bedtime. Take with or immediately following a meal.   multivitamin with minerals tablet Take 1 tablet by mouth daily.   naproxen sodium 220 MG tablet Commonly known as: ALEVE Take 220 mg by mouth 2 (two) times daily with a meal.   nitroGLYCERIN 0.4 MG SL tablet Commonly known as: NITROSTAT Place 1 tablet (0.4 mg total) under the tongue every 5 (five) minutes x 3 doses as needed for chest pain.   oxyCODONE-acetaminophen 5-325 MG tablet Commonly known as: PERCOCET/ROXICET Take 1 tablet by mouth every 4 (four) hours as needed for pain.   sacubitril-valsartan 24-26 MG Commonly known as: ENTRESTO Take 1 tablet by mouth 2 (two) times daily.   spironolactone 25 MG tablet Commonly known as: ALDACTONE Take 0.5 tablets (12.5 mg total) by mouth daily.        Acute coronary syndrome (MI, NSTEMI, STEMI, etc) this admission?:  No.  The elevated Troponin was due to the acute medical illness or demand ischemia.    Outstanding Labs/Studies   BMP in 1 week   Duration of Discharge Encounter   Greater than 30 minutes including physician time.  Signed, Roe Rutherfordngela Nicole  [facility], PA 02/09/2019, 4:39 PM

## 2019-02-09 NOTE — Progress Notes (Signed)
During discharge process, I discovered that the patient is a new diabetic.  Ordered Living Well with Diabetes book; gave to patient to take home.  Obtained scrips for glucometer, test strips, and lancets from Melina Copa, PA.  Patient going home on new med of glucophage.  Strongly urged patient to make appointment with primary physician at [facility] within one week  for hospitalization follow up and diabetes control.  Urged patient to call his primary physician with any questions related to his glucometer or blood sugars.  Attempted unsuccessfully to contact diabetic nurse prior to discharge.

## 2019-02-09 NOTE — Progress Notes (Signed)
MEWS score of 2 secondary to RR, HR, SBP.  Discussed with Fabian Sharp, PA and Eleonore Chiquito, MD.

## 2019-02-09 NOTE — Progress Notes (Signed)
Patient ambulated greater than 600 feet with no symptoms of any kind, VSS.  Seen by Fabian Sharp, PA and preparing for discharge home.

## 2019-02-09 NOTE — Anesthesia Preprocedure Evaluation (Addendum)
Anesthesia Evaluation  Patient identified by MRN, date of birth, ID band Patient awake    Reviewed: Allergy & Precautions, NPO status , Patient's Chart, lab work & pertinent test results  Airway Mallampati: II  TM Distance: >3 FB     Dental   Pulmonary Current Smoker,    breath sounds clear to auscultation       Cardiovascular hypertension, Pt. on medications +CHF  + dysrhythmias  Rhythm:Regular Rate:Normal  EF 20-25%. Moderately reduced RV    Neuro/Psych negative neurological ROS     GI/Hepatic negative GI ROS, Neg liver ROS,   Endo/Other  diabetes  Renal/GU negative Renal ROS     Musculoskeletal   Abdominal   Peds  Hematology negative hematology ROS (+)   Anesthesia Other Findings   Reproductive/Obstetrics                             Anesthesia Physical Anesthesia Plan  ASA: IV  Anesthesia Plan: General   Post-op Pain Management:    Induction: Intravenous  PONV Risk Score and Plan: 0 and Propofol infusion, Ondansetron and Treatment may vary due to age or medical condition  Airway Management Planned: Natural Airway, Nasal Cannula and Mask  Additional Equipment:   Intra-op Plan:   Post-operative Plan:   Informed Consent: I have reviewed the patients History and Physical, chart, labs and discussed the procedure including the risks, benefits and alternatives for the proposed anesthesia with the patient or authorized representative who has indicated his/her understanding and acceptance.       Plan Discussed with: CRNA  Anesthesia Plan Comments:         Anesthesia Quick Evaluation

## 2019-02-09 NOTE — Interval H&P Note (Signed)
History and Physical Interval Note:  02/09/2019 11:10 AM  Robert Middleton  has presented today for surgery, with the diagnosis of ATRIAL FIBRILLATION.  The various methods of treatment have been discussed with the patient and family. After consideration of risks, benefits and other options for treatment, the patient has consented to  Procedure(s): TRANSESOPHAGEAL ECHOCARDIOGRAM (TEE) (N/A) CARDIOVERSION (N/A) as a surgical intervention.  The patient's history has been reviewed, patient examined, no change in status, stable for surgery.  I have reviewed the patient's chart and labs.  Questions were answered to the patient's satisfaction.     [name]

## 2019-02-09 NOTE — CV Procedure (Addendum)
TEE/DCC: Anesthesia: Propofol On Eliquis  Moderate bi atrial enlargement No PFO No LAA thrombus Diffuse hypokinesis EF 20-25% Mild MR Mild TR Normal AV No effusion   DCC x 1 150 J biphasic Converted from afib rate 120-140 NSR rate 55-60 bpm  Continue Entresto, Toprol, Eliquis  Add aldactone 12.5 mg daily F/U CHF clinic 2 weeks with labs BMET/BNP F/U with me in 6 weeks Will need cardiac CTA to r/o CAD if he maintains NSR And f/u echo /MRI in 3 months to reassess EF  Will also need f/u [facility] for DM A1c 6.7  > start glucophage  500 mg daily at d/c   [employer]

## 2019-02-09 NOTE — Progress Notes (Signed)
  Echocardiogram Echocardiogram Transesophageal has been performed.  Robert Middleton 02/09/2019, 1:09 PM

## 2019-02-09 NOTE — Progress Notes (Signed)
Nurse paged that pt needed rx for glucometer, test strips, lancets. Paper rx provided. Cher Egnor PA-C

## 2019-02-10 ENCOUNTER — Encounter (HOSPITAL_COMMUNITY): Payer: Self-pay | Admitting: Cardiovascular Disease

## 2019-02-10 NOTE — Anesthesia Postprocedure Evaluation (Signed)
Anesthesia Post Note  Patient: Robert Middleton  Procedure(s) Performed: TRANSESOPHAGEAL ECHOCARDIOGRAM (TEE) (N/A ) CARDIOVERSION (N/A )     Patient location during evaluation: PACU Anesthesia Type: General Level of consciousness: awake and alert Pain management: pain level controlled Vital Signs Assessment: post-procedure vital signs reviewed and stable Respiratory status: spontaneous breathing, nonlabored ventilation, respiratory function stable and patient connected to nasal cannula oxygen Cardiovascular status: blood pressure returned to baseline and stable Postop Assessment: no apparent nausea or vomiting Anesthetic complications: no    Last Vitals:  Vitals:   02/09/19 1435 02/09/19 1615  BP: 96/79 131/82  Pulse: 60   Resp:    Temp:    SpO2: 99%     Last Pain:  Vitals:   02/09/19 1319  TempSrc:   PainSc: 0-No pain                 Tiajuana Amass

## 2019-02-17 NOTE — Progress Notes (Signed)
ADVANCED HF CLINIC CONSULT NOTE  Referring Physician: Dr Eden Emms Primary Care: Primary Cardiologist: Dr Eden Emms    HPI: Mr Robert Middleton is a 53 year old with a history of HTN, PAF, and newly diagnosed systolic heart failure. Strong family history of cardiac disease. Father and brother had MI. Father died at the age of 72 from heart complication.  Admitted in 02/06/19  A Fib RVR. ECHO performed and showed severely reduced EF. He later underwent TEE/DC-CV with restoration of NSR. Started Hf meds with recommendations to follow up in the HF clinic.  Today he presents as a  new patient at the request of Dr Eden Emms for heart failure consultation. Since discharge he has been feeling ok but early this morning he got up and went to the bathroom and felt his heart racing. Says his heart rate at that time was in 130s.  Mild SOB with exertion.  Denies PND/Orthopnea. Appetite ok. No fever or chills. Weight at home 250 pounds. Taking all medications. He has not missed any dose of eliquis. Lives alone.    Cardiac Test ECHO 02/06/2019  . The left ventricle has severely reduced systolic function, with an ejection fraction of 20-25%. The cavity size was mildly dilated. Left ventricular diastolic function could not be evaluated secondary to atrial fibrillation. Left ventrical global  hypokinesis without regional wall motion abnormalities.  2. The right ventricle has moderately reduced systolic function. The cavity was mildly enlarged. There is no increase in right ventricular wall thickness. Right ventricular systolic pressure could not be assessed.  3. Left atrial size was moderately dilated.  4. Right atrial size was moderately dilated.  5. The aorta is not well visualized unless otherwise noted.  6. The inferior vena cava was dilated in size with <50% respiratory variability.  7. No intracardiac thrombi or masses were visualized.  FH:  Father had MI at age 86 requiring CABG.  Died of cardiac complication at age  24. Brother MI at age 10. Brother has diabetes.   Review of Systems: [y] = yes, [ ]  = no   General: Weight gain [ ] ; Weight loss [ ] ; Anorexia [ ] ; Fatigue [Y ]; Fever [ ] ; Chills [ ] ; Weakness [Y ]  Cardiac: Chest pain/pressure [ ] ; Resting SOB [ ] ; Exertional SOB [Y ]; Orthopnea [ ] ; Pedal Edema [ ] ; Palpitations [ ] ; Syncope [ ] ; Presyncope [ ] ; Paroxysmal nocturnal dyspnea[ ]   Pulmonary: Cough [ ] ; Wheezing[ ] ; Hemoptysis[ ] ; Sputum [ ] ; Snoring [ ]   GI: Vomiting[ ] ; Dysphagia[ ] ; Melena[ ] ; Hematochezia [ ] ; Heartburn[ ] ; Abdominal pain [ ] ; Constipation [ ] ; Diarrhea [ ] ; BRBPR [ ]   GU: Hematuria[ ] ; Dysuria [ ] ; Nocturia[ ]   Vascular: Pain in legs with walking [ ] ; Pain in feet with lying flat [ ] ; Non-healing sores [ ] ; Stroke [ ] ; TIA [ ] ; Slurred speech [ ] ;  Neuro: Headaches[ ] ; Vertigo[ ] ; Seizures[ ] ; Paresthesias[ ] ;Blurred vision [ ] ; Diplopia [ ] ; Vision changes [ ]   Ortho/Skin: Arthritis [ ] ; Joint pain [ ] ; Muscle pain [ ] ; Joint swelling [ ] ; Back Pain [Y]; Rash [ ]   Psych: Depression[ ] ; Anxiety[ ]   Heme: Bleeding problems [ ] ; Clotting disorders [ ] ; Anemia [ ]   Endocrine: Diabetes [ ] ; Thyroid dysfunction[ ]    Past Medical History:  Diagnosis Date  . Atrial fibrillation (HCC) 01/2009  . Hypertension     Current Outpatient Medications  Medication Sig Dispense Refill  . apixaban (ELIQUIS) 5 MG  TABS tablet Take 1 tablet (5 mg total) by mouth 2 (two) times daily. 180 tablet 3  . BLACK ELDERBERRY PO Take 1 tablet by mouth daily. W/ cloves and honey    . metFORMIN (GLUCOPHAGE-XR) 500 MG 24 hr tablet Take 1 tablet (500 mg total) by mouth daily with breakfast. 90 tablet 3  . metoprolol succinate (TOPROL-XL) 100 MG 24 hr tablet Take 1 tablet (100 mg total) by mouth at bedtime. Take with or immediately following a meal. 90 tablet 3  . Multiple Vitamins-Minerals (MULTIVITAMIN WITH MINERALS) tablet Take 1 tablet by mouth daily.    . naproxen sodium (ANAPROX) 220 MG tablet  Take 220 mg by mouth 2 (two) times daily with a meal.    . nitroGLYCERIN (NITROSTAT) 0.4 MG SL tablet Place 1 tablet (0.4 mg total) under the tongue every 5 (five) minutes x 3 doses as needed for chest pain. 25 tablet 3  . oxyCODONE-acetaminophen (PERCOCET/ROXICET) 5-325 MG per tablet Take 1 tablet by mouth every 4 (four) hours as needed for pain. (Patient not taking: Reported on 02/05/2019) 5 tablet 0  . sacubitril-valsartan (ENTRESTO) 24-26 MG Take 1 tablet by mouth 2 (two) times daily. 60 tablet 3  . spironolactone (ALDACTONE) 25 MG tablet Take 0.5 tablets (12.5 mg total) by mouth daily. 30 tablet 3   No current facility-administered medications for this visit.     No Known Allergies    Social History   Socioeconomic History  . Marital status: Single    Spouse name: Not on file  . Number of children: Not on file  . Years of education: Not on file  . Highest education level: Not on file  Occupational History  . Not on file  Social Needs  . Financial resource strain: Not on file  . Food insecurity    Worry: Not on file    Inability: Not on file  . Transportation needs    Medical: Not on file    Non-medical: Not on file  Tobacco Use  . Smoking status: Current Every Day Smoker    Types: Cigars  . Smokeless tobacco: Never Used  Substance and Sexual Activity  . Alcohol use: Yes    Comment: 1 pint per week  . Drug use: Yes    Types: Marijuana  . Sexual activity: Not on file  Lifestyle  . Physical activity    Days per week: Not on file    Minutes per session: Not on file  . Stress: Not on file  Relationships  . Social Herbalist on phone: Not on file    Gets together: Not on file    Attends religious service: Not on file    Active member of club or organization: Not on file    Attends meetings of clubs or organizations: Not on file    Relationship status: Not on file  . Intimate partner violence    Fear of current or ex partner: Not on file    Emotionally  abused: Not on file    Physically abused: Not on file    Forced sexual activity: Not on file  Other Topics Concern  . Not on file  Social History Narrative  . Not on file      Family History  Problem Relation Age of Onset  . Heart attack Mother 21  . CAD Mother   . Heart disease Mother   . Heart attack Brother 53    Vitals:   02/18/19 1059  Pulse: Marland Kitchen)  136  SpO2: 98%  Weight: 116.4 kg (256 lb 9.6 oz)   Doppler BP 110   PHYSICAL EXAM: General:   No respiratory difficulty HEENT: normal Neck: supple. no JVD. Carotids 2+ bilat; no bruits. No lymphadenopathy or thryomegaly appreciated. Cor: PMI nondisplaced. Tachy Irregular rate & rhythm. No rubs, gallops or murmurs. Lungs: clear Abdomen: soft, nontender, nondistended. No hepatosplenomegaly. No bruits or masses. Good bowel sounds. Extremities: no cyanosis, clubbing, rash, edema Neuro: alert & oriented x 3, cranial nerves grossly intact. moves all 4 extremities w/o difficulty. Affect pleasant.  ECG: A fib RVR 150 bpm    ASSESSMENT & PLAN: 1. Chronic Systolic HF  Recently diagnosed with reduced EF. EF earlier this month 20-25%. Presumed Tachy-mediated could also have ICM given strong family history CAD.  Volume status stable. SBP soft. Stop BB. Starting amiodarone drip.  Hold entresto/spiro for now.  Set up for RHC/LHC tomorrow. Hold eliquis for now.   2.   A fib RVR  EKG A fib 150 bpm.  Start amiodarone drip . Hold eliquis tonight.   3. HTN  Low today. Hold entresto/spiro.    Admit to cardiac progressive unit. Check CBC, CMET, BNP, TSH. Start amio drip. Plan for RHC/LHC tomorrow. He will need cardioversion down the road he is doesn't convert to NSR with amio drip.   Discussed with Dr Gala Romney.  Sparkle Aube NP-C  11:41 AM

## 2019-02-18 ENCOUNTER — Ambulatory Visit (HOSPITAL_COMMUNITY)
Admission: RE | Admit: 2019-02-18 | Discharge: 2019-02-18 | Disposition: A | Discharge status: Home / Self Care | Payer: Non-veteran care | Source: Ambulatory Visit | Attending: Adult Health | Admitting: Adult Health

## 2019-02-18 ENCOUNTER — Other Ambulatory Visit: Payer: Self-pay

## 2019-02-18 ENCOUNTER — Inpatient Hospital Stay (HOSPITAL_COMMUNITY)
Admission: AD | Admit: 2019-02-18 | Discharge: 2019-02-25 | DRG: 286 | Disposition: A | Discharge status: Home Health | Payer: No Typology Code available for payment source | Source: Ambulatory Visit | Attending: Internal Medicine | Admitting: Internal Medicine

## 2019-02-18 ENCOUNTER — Inpatient Hospital Stay (HOSPITAL_COMMUNITY): Payer: Non-veteran care

## 2019-02-18 VITALS — BP 110/0 | HR 136 | Wt 256.6 lb

## 2019-02-18 DIAGNOSIS — I110 Hypertensive heart disease with heart failure: Secondary | ICD-10-CM | POA: Insufficient documentation

## 2019-02-18 DIAGNOSIS — I10 Essential (primary) hypertension: Secondary | ICD-10-CM

## 2019-02-18 DIAGNOSIS — I472 Ventricular tachycardia: Secondary | ICD-10-CM | POA: Diagnosis not present

## 2019-02-18 DIAGNOSIS — R0600 Dyspnea, unspecified: Secondary | ICD-10-CM

## 2019-02-18 DIAGNOSIS — Z7901 Long term (current) use of anticoagulants: Secondary | ICD-10-CM

## 2019-02-18 DIAGNOSIS — Z79899 Other long term (current) drug therapy: Secondary | ICD-10-CM | POA: Insufficient documentation

## 2019-02-18 DIAGNOSIS — I428 Other cardiomyopathies: Secondary | ICD-10-CM

## 2019-02-18 DIAGNOSIS — Z20828 Contact with and (suspected) exposure to other viral communicable diseases: Secondary | ICD-10-CM | POA: Diagnosis present

## 2019-02-18 DIAGNOSIS — Z7984 Long term (current) use of oral hypoglycemic drugs: Secondary | ICD-10-CM | POA: Insufficient documentation

## 2019-02-18 DIAGNOSIS — Z8249 Family history of ischemic heart disease and other diseases of the circulatory system: Secondary | ICD-10-CM

## 2019-02-18 DIAGNOSIS — I5022 Chronic systolic (congestive) heart failure: Secondary | ICD-10-CM

## 2019-02-18 DIAGNOSIS — I5021 Acute systolic (congestive) heart failure: Secondary | ICD-10-CM

## 2019-02-18 DIAGNOSIS — E118 Type 2 diabetes mellitus with unspecified complications: Secondary | ICD-10-CM | POA: Diagnosis present

## 2019-02-18 DIAGNOSIS — I130 Hypertensive heart and chronic kidney disease with heart failure and stage 1 through stage 4 chronic kidney disease, or unspecified chronic kidney disease: Secondary | ICD-10-CM | POA: Diagnosis present

## 2019-02-18 DIAGNOSIS — I480 Paroxysmal atrial fibrillation: Principal | ICD-10-CM | POA: Diagnosis present

## 2019-02-18 DIAGNOSIS — F1290 Cannabis use, unspecified, uncomplicated: Secondary | ICD-10-CM | POA: Diagnosis present

## 2019-02-18 DIAGNOSIS — I4891 Unspecified atrial fibrillation: Secondary | ICD-10-CM | POA: Diagnosis not present

## 2019-02-18 DIAGNOSIS — E669 Obesity, unspecified: Secondary | ICD-10-CM | POA: Diagnosis present

## 2019-02-18 DIAGNOSIS — E1122 Type 2 diabetes mellitus with diabetic chronic kidney disease: Secondary | ICD-10-CM | POA: Diagnosis present

## 2019-02-18 DIAGNOSIS — Z6834 Body mass index (BMI) 34.0-34.9, adult: Secondary | ICD-10-CM

## 2019-02-18 DIAGNOSIS — IMO0001 Reserved for inherently not codable concepts without codable children: Secondary | ICD-10-CM

## 2019-02-18 DIAGNOSIS — I493 Ventricular premature depolarization: Secondary | ICD-10-CM | POA: Diagnosis not present

## 2019-02-18 DIAGNOSIS — N183 Chronic kidney disease, stage 3 (moderate): Secondary | ICD-10-CM | POA: Diagnosis present

## 2019-02-18 DIAGNOSIS — I5023 Acute on chronic systolic (congestive) heart failure: Secondary | ICD-10-CM | POA: Diagnosis present

## 2019-02-18 DIAGNOSIS — F17210 Nicotine dependence, cigarettes, uncomplicated: Secondary | ICD-10-CM | POA: Diagnosis present

## 2019-02-18 DIAGNOSIS — Z833 Family history of diabetes mellitus: Secondary | ICD-10-CM

## 2019-02-18 DIAGNOSIS — F17290 Nicotine dependence, other tobacco product, uncomplicated: Secondary | ICD-10-CM | POA: Insufficient documentation

## 2019-02-18 LAB — COMPREHENSIVE METABOLIC PANEL
ALT: 58 U/L — ABNORMAL HIGH (ref 0–44)
AST: 37 U/L (ref 15–41)
Albumin: 3.4 g/dL — ABNORMAL LOW (ref 3.5–5.0)
Alkaline Phosphatase: 59 U/L (ref 38–126)
Anion gap: 7 (ref 5–15)
BUN: 20 mg/dL (ref 6–20)
CO2: 24 mmol/L (ref 22–32)
Calcium: 8.9 mg/dL (ref 8.9–10.3)
Chloride: 108 mmol/L (ref 98–111)
Creatinine, Ser: 1.56 mg/dL — ABNORMAL HIGH (ref 0.61–1.24)
GFR calc Af Amer: 59 mL/min — ABNORMAL LOW (ref >60)
GFR calc non Af Amer: 51 mL/min — ABNORMAL LOW (ref >60)
Glucose, Bld: 129 mg/dL — ABNORMAL HIGH (ref 70–99)
Potassium: 4.4 mmol/L (ref 3.5–5.1)
Sodium: 139 mmol/L (ref 135–145)
Total Bilirubin: 1 mg/dL (ref 0.3–1.2)
Total Protein: 6.8 g/dL (ref 6.5–8.1)

## 2019-02-18 LAB — CBC
HCT: 43.2 % (ref 39.0–52.0)
Hemoglobin: 14.5 g/dL (ref 13.0–17.0)
MCH: 32.7 pg (ref 26.0–34.0)
MCHC: 33.6 g/dL (ref 30.0–36.0)
MCV: 97.3 fL (ref 80.0–100.0)
Platelets: 294 10*3/uL (ref 150–400)
RBC: 4.44 MIL/uL (ref 4.22–5.81)
RDW: 13.9 % (ref 11.5–15.5)
WBC: 8.8 10*3/uL (ref 4.0–10.5)
nRBC: 0 % (ref 0.0–0.2)

## 2019-02-18 LAB — TSH: TSH: 4.243 u[IU]/mL (ref 0.350–4.500)

## 2019-02-18 LAB — SARS CORONAVIRUS 2 (TAT 6-24 HRS): SARS Coronavirus 2: NEGATIVE

## 2019-02-18 LAB — BRAIN NATRIURETIC PEPTIDE: B Natriuretic Peptide: 964.5 pg/mL — ABNORMAL HIGH (ref 0.0–100.0)

## 2019-02-18 MED ORDER — AMIODARONE HCL IN DEXTROSE 360-4.14 MG/200ML-% IV SOLN
60.0000 mg/h | INTRAVENOUS | Status: AC
  Administered 2019-02-18: 60 mg/h via INTRAVENOUS
  Filled 2019-02-18 (×2): qty 200

## 2019-02-18 MED ORDER — ONDANSETRON HCL 4 MG/2ML IJ SOLN: 4.0000 mg | Freq: Four times a day (QID) | INTRAMUSCULAR | Status: DC | PRN

## 2019-02-18 MED ORDER — ASPIRIN 81 MG PO CHEW
81.0000 mg | CHEWABLE_TABLET | ORAL | Status: AC
  Administered 2019-02-19: 81 mg via ORAL
  Filled 2019-02-18: qty 1

## 2019-02-18 MED ORDER — SODIUM CHLORIDE 0.9 % IV SOLN: 250.0000 mL | INTRAVENOUS | Status: DC | PRN

## 2019-02-18 MED ORDER — HEPARIN (PORCINE) 25000 UT/250ML-% IV SOLN
1400.0000 [IU]/h | INTRAVENOUS | Status: DC
  Administered 2019-02-19: 1400 [IU]/h via INTRAVENOUS
  Filled 2019-02-18: qty 250

## 2019-02-18 MED ORDER — AMIODARONE HCL IN DEXTROSE 360-4.14 MG/200ML-% IV SOLN
60.0000 mg/h | INTRAVENOUS | Status: AC
  Administered 2019-02-19 – 2019-02-23 (×8): 30 mg/h via INTRAVENOUS
  Administered 2019-02-24 – 2019-02-25 (×4): 60 mg/h via INTRAVENOUS
  Filled 2019-02-18 (×19): qty 200

## 2019-02-18 MED ORDER — AMIODARONE LOAD VIA INFUSION
150.0000 mg | Freq: Once | INTRAVENOUS | Status: AC
  Administered 2019-02-18: 150 mg via INTRAVENOUS
  Filled 2019-02-18: qty 83.34

## 2019-02-18 MED ORDER — ACETAMINOPHEN 325 MG PO TABS: 650.0000 mg | ORAL_TABLET | ORAL | Status: DC | PRN

## 2019-02-18 MED ORDER — SODIUM CHLORIDE 0.9 % IV SOLN
INTRAVENOUS | Status: DC
  Administered 2019-02-19: 07:00:00 via INTRAVENOUS

## 2019-02-18 NOTE — Addendum Note (Signed)
Encounter addended by: Valeda Malm, RN on: 02/18/2019 3:58 PM  Actions taken: Clinical Note Signed

## 2019-02-18 NOTE — Addendum Note (Signed)
Encounter addended by: Valeda Malm, RN on: 02/18/2019 12:10 PM  Actions taken: Charge Capture section accepted

## 2019-02-18 NOTE — H&P (Addendum)
ADVANCED HEART FAILURE H&P  This note reflects work completed today. Admitted from HF clinic to [facility].   HPI: Mr Galli is a 53 year old with a history of HTN, PAF, DMII, and newly diagnosed systolic heart failure. Strong family history of cardiac disease. Father and brother had MI. Father died at the age of 66 from heart complication.  Admitted in 02/06/19  A Fib RVR. ECHO performed and showed severely reduced EF. He later underwent TEE/DC-CV with restoration of NSR. Started Hf meds with recommendations to follow up in the HF clinic.  Today he presents as a  new patient at the request of Dr Eden Emms for heart failure consultation. Since discharge he has been feeling ok but early this morning he got up and went to the bathroom and felt his heart racing. Says his heart rate at that time was in 130s.  Mild SOB with exertion.  Denies PND/Orthopnea. Appetite ok. No fever or chills. Weight at home 250 pounds. Taking all medications. He has not missed any dose of eliquis. Lives alone.    Cardiac Test ECHO 02/06/2019  . The left ventricle has severely reduced systolic function, with an ejection fraction of 20-25%. The cavity size was mildly dilated. Left ventricular diastolic function could not be evaluated secondary to atrial fibrillation. Left ventrical global  hypokinesis without regional wall motion abnormalities. 2. The right ventricle has moderately reduced systolic function. The cavity was mildly enlarged. There is no increase in right ventricular wall thickness. Right ventricular systolic pressure could not be assessed. 3. Left atrial size was moderately dilated. 4. Right atrial size was moderately dilated. 5. The aorta is not well visualized unless otherwise noted. 6. The inferior vena cava was dilated in size with <50% respiratory variability. 7. No intracardiac thrombi or masses were visualized.  FH:  Father had MI at age 64 requiring CABG. Died of cardiac complication at  age 49. Brother MI at age 51. Brother has diabetes.   Review of Systems: [y] = yes, [ ]  = no    General: Weight gain [ ] ; Weight loss [ ] ; Anorexia [ ] ; Fatigue [Y ]; Fever [ ] ; Chills [ ] ; Weakness [Y ]   Cardiac: Chest pain/pressure [ ] ; Resting SOB [ ] ; Exertional SOB [Y ]; Orthopnea [ ] ; Pedal Edema [ ] ; Palpitations [ ] ; Syncope [ ] ; Presyncope [ ] ; Paroxysmal nocturnal dyspnea[ ]    Pulmonary: Cough [ ] ; Wheezing[ ] ; Hemoptysis[ ] ; Sputum [ ] ; Snoring [ ]    GI: Vomiting[ ] ; Dysphagia[ ] ; Melena[ ] ; Hematochezia [ ] ; Heartburn[ ] ; Abdominal pain [ ] ; Constipation [ ] ; Diarrhea [ ] ; BRBPR [ ]    GU: Hematuria[ ] ; Dysuria [ ] ; Nocturia[ ]    Vascular: Pain in legs with walking [ ] ; Pain in feet with lying flat [ ] ; Non-healing sores [ ] ; Stroke [ ] ; TIA [ ] ; Slurred speech [ ] ;   Neuro: Headaches[ ] ; Vertigo[ ] ; Seizures[ ] ; Paresthesias[ ] ;Blurred vision [ ] ; Diplopia [ ] ; Vision changes [ ]    Ortho/Skin: Arthritis [ ] ; Joint pain [ ] ; Muscle pain [ ] ; Joint swelling [ ] ; Back Pain [Y]; Rash [ ]    Psych: Depression[ ] ; Anxiety[ ]    Heme: Bleeding problems [ ] ; Clotting disorders [ ] ; Anemia [ ]    Endocrine: Diabetes [ ] ; Thyroid dysfunction[ ]        Past Medical History:  Diagnosis Date  . Atrial fibrillation (HCC) 01/2009  . Hypertension  Current Outpatient Medications  Medication Sig Dispense Refill  . apixaban (ELIQUIS) 5 MG TABS tablet Take 1 tablet (5 mg total) by mouth 2 (two) times daily. 180 tablet 3  . BLACK ELDERBERRY PO Take 1 tablet by mouth daily. W/ cloves and honey    . metFORMIN (GLUCOPHAGE-XR) 500 MG 24 hr tablet Take 1 tablet (500 mg total) by mouth daily with breakfast. 90 tablet 3  . metoprolol succinate (TOPROL-XL) 100 MG 24 hr tablet Take 1 tablet (100 mg total) by mouth at bedtime. Take with or immediately following a meal. 90 tablet 3  . Multiple Vitamins-Minerals (MULTIVITAMIN WITH MINERALS) tablet Take 1 tablet by mouth daily.     . naproxen sodium (ANAPROX) 220 MG tablet Take 220 mg by mouth 2 (two) times daily with a meal.    . nitroGLYCERIN (NITROSTAT) 0.4 MG SL tablet Place 1 tablet (0.4 mg total) under the tongue every 5 (five) minutes x 3 doses as needed for chest pain. 25 tablet 3  . oxyCODONE-acetaminophen (PERCOCET/ROXICET) 5-325 MG per tablet Take 1 tablet by mouth every 4 (four) hours as needed for pain. (Patient not taking: Reported on 02/05/2019) 5 tablet 0  . sacubitril-valsartan (ENTRESTO) 24-26 MG Take 1 tablet by mouth 2 (two) times daily. 60 tablet 3  . spironolactone (ALDACTONE) 25 MG tablet Take 0.5 tablets (12.5 mg total) by mouth daily. 30 tablet 3   No current facility-administered medications for this visit.     No Known Allergies    Social History        Socioeconomic History  . Marital status: Single    Spouse name: Not on file  . Number of children: Not on file  . Years of education: Not on file  . Highest education level: Not on file  Occupational History  . Not on file  Social Needs  . Financial resource strain: Not on file  . Food insecurity    Worry: Not on file    Inability: Not on file  . Transportation needs    Medical: Not on file    Non-medical: Not on file  Tobacco Use  . Smoking status: Current Every Day Smoker    Types: Cigars  . Smokeless tobacco: Never Used  Substance and Sexual Activity  . Alcohol use: Yes    Comment: 1 pint per week  . Drug use: Yes    Types: Marijuana  . Sexual activity: Not on file  Lifestyle  . Physical activity    Days per week: Not on file    Minutes per session: Not on file  . Stress: Not on file  Relationships  . Social Herbalist on phone: Not on file    Gets together: Not on file    Attends religious service: Not on file    Active member of club or organization: Not on file    Attends meetings of clubs or organizations: Not on file    Relationship status: Not on file  .  Intimate partner violence    Fear of current or ex partner: Not on file    Emotionally abused: Not on file    Physically abused: Not on file    Forced sexual activity: Not on file  Other Topics Concern  . Not on file  Social History Narrative  . Not on file           Family History  Problem Relation Age of Onset  . Heart attack Mother 27  . CAD  Mother   . Heart disease Mother   . Heart attack Brother 53       Vitals:   02/18/19 1059  Pulse: (!) 136  SpO2: 98%  Weight: 116.4 kg (256 lb 9.6 oz)   Doppler BP 110   PHYSICAL EXAM: General:   No respiratory difficulty HEENT: normal Neck: supple. no JVD. Carotids 2+ bilat; no bruits. No lymphadenopathy or thryomegaly appreciated. Cor: PMI nondisplaced. Tachy Irregular rate & rhythm. No rubs, gallops or murmurs. Lungs: clear Abdomen: soft, nontender, nondistended. No hepatosplenomegaly. No bruits or masses. Good bowel sounds. Extremities: no cyanosis, clubbing, rash, edema Neuro: alert & oriented x 3, cranial nerves grossly intact. moves all 4 extremities w/o difficulty. Affect pleasant.  ECG: A fib RVR 150 bpm    ASSESSMENT & PLAN: 1. Chronic Systolic HF  Recently diagnosed with reduced EF. EF earlier this month 20-25%. Presumed Tachy-mediated could also have ICM given strong family history CAD.  Volume status stable. SBP soft. Stop BB. Starting amiodarone drip.  Hold entresto/spiro for now.  Set up for RHC/LHC tomorrow. Hold eliquis for now.   2.   A fib RVR  EKG A fib 150 bpm.  Start amiodarone drip . Hold eliquis tonight.   3. HTN  Low today. Hold entresto/spiro.   4. DM II Check Hgb A1C in am.    Admit to cardiac progressive unit. Check CBC, CMET, BNP, TSH. Start amio drip. Plan for RHC/LHC tomorrow. He will need cardioversion down the road he is doesn't convert to NSR with amio drip.   Tonye Becket /12:06 PM   Patient seen and examined with the above-signed Advanced Practice Provider  and/or Housestaff. I personally reviewed laboratory data, imaging studies and relevant notes. I independently examined the patient and formulated the important aspects of the plan. I have edited the note to reflect any of my changes or salient points. I have personally discussed the plan with the patient and/or family.  53 y/o male with h/o HTN, obesity, PAF and newly diagnosed systolic HF with EF 20-25%. Underwent DC-CV recently and now back in AF with RVR with NYHA III symptoms.  On exam General:  Well appearing. No resp difficulty HEENT: normal Neck: supple. JVP 9 Carotids 2+ bilat; no bruits. No lymphadenopathy or thryomegaly appreciated. Cor: PMI nondisplaced. Irregular tachy. No rubs, gallops or murmurs. Lungs: clear Abdomen: obese soft, nontender, nondistended. No hepatosplenomegaly. No bruits or masses. Good bowel sounds. Extremities: no cyanosis, clubbing, rash, edema Neuro: alert & orientedx3, cranial nerves grossly intact. moves all 4 extremities w/o difficulty. 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 Admit. Start IV amio. Plan R/L cath tomorrow. Hold Eliquis tonight and in am. Start heparin in am. Will likely need repeat DC-CV followed by referral to AF ablation.   Arvilla Meres, MD  4:46 PM

## 2019-02-18 NOTE — Progress Notes (Signed)
ANTICOAGULATION CONSULT NOTE - Initial Consult  Pharmacy Consult for heparin  Indication: atrial fibrillation  No Known Allergies  Patient Measurements: Weight: 252 lb 12.8 oz (114.7 kg) Heparin Dosing Weight: 102kg  Vital Signs: Temp: 97.7 F (36.5 C) (09/23 1614) Temp Source: Oral (09/23 1614) BP: 108/85 (09/23 1614) Pulse Rate: 136 (09/23 1059)  Labs: Recent Labs    02/18/19 1135  HGB 14.5  HCT 43.2  PLT 294  CREATININE 1.56*    Estimated Creatinine Clearance: 73.2 mL/min (A) (by C-G formula based on SCr of 1.56 mg/dL (H)).   Medical History: Past Medical History:  Diagnosis Date  . Atrial fibrillation ([place]) 01/2009  . Hypertension     Assessment: 53 year old male with paf on apixaban prior to admit. Recurrent PAF with RVR, plan for R/LHC in am. Will hold apixaban tonight and orders to start IV heparin in am prior to cath.   Heparin level likely to be elevated, will rely on aptt for dose adjustments. Will check baseline aptt/hl with am labs.   Goal of Therapy:  Heparin level 0.3-0.7 units/ml aPTT 66-102 seconds Monitor platelets by anticoagulation protocol: Yes   Plan:  Start heparin infusion at 1400 units/hr Check anti-Xa level in 6 hours vs. follow up after cath Continue to monitor H&H and platelets  Erin Hearing PharmD., BCPS Clinical Pharmacist 02/18/2019 6:43 PM

## 2019-02-18 NOTE — Plan of Care (Signed)
  Problem: Education: Goal: Knowledge of General Education information will improve Description: Including pain rating scale, medication(s)/side effects and non-pharmacologic comfort measures 02/18/2019 2119 by Roslyn Smiling, RN Outcome: Progressing 02/18/2019 2119 by Roslyn Smiling, RN Outcome: Progressing   Problem: Health Behavior/Discharge Planning: Goal: Ability to manage health-related needs will improve 02/18/2019 2119 by Roslyn Smiling, RN Outcome: Progressing 02/18/2019 2119 by Roslyn Smiling, RN Outcome: Progressing   Problem: Clinical Measurements: Goal: Ability to maintain clinical measurements within normal limits will improve 02/18/2019 2119 by Roslyn Smiling, RN Outcome: Progressing 02/18/2019 2119 by Roslyn Smiling, RN Outcome: Progressing Goal: Will remain free from infection 02/18/2019 2119 by Roslyn Smiling, RN Outcome: Progressing 02/18/2019 2119 by Roslyn Smiling, RN Outcome: Progressing Goal: Diagnostic test results will improve 02/18/2019 2119 by Roslyn Smiling, RN Outcome: Progressing 02/18/2019 2119 by Roslyn Smiling, RN Outcome: Progressing

## 2019-02-18 NOTE — Progress Notes (Signed)
Gave report to [employer].  Escorted patient to 2c-07 via wheelchair. Pt without any complaints.  RN met patient upon arrival. [facility] patient into room.

## 2019-02-19 ENCOUNTER — Ambulatory Visit (HOSPITAL_COMMUNITY): Admit: 2019-02-19 | Payer: Non-veteran care | Admitting: Internal Medicine

## 2019-02-19 ENCOUNTER — Encounter (HOSPITAL_COMMUNITY): Payer: Self-pay | Admitting: *Deleted

## 2019-02-19 ENCOUNTER — Encounter (HOSPITAL_COMMUNITY)
Admission: AD | Disposition: A | Discharge status: Home Health | Payer: Self-pay | Source: Ambulatory Visit | Attending: Internal Medicine

## 2019-02-19 DIAGNOSIS — I5022 Chronic systolic (congestive) heart failure: Secondary | ICD-10-CM | POA: Diagnosis not present

## 2019-02-19 DIAGNOSIS — I4891 Unspecified atrial fibrillation: Secondary | ICD-10-CM | POA: Diagnosis not present

## 2019-02-19 HISTORY — PX: RIGHT/LEFT HEART CATH AND CORONARY ANGIOGRAPHY: CATH118266

## 2019-02-19 LAB — BASIC METABOLIC PANEL
Anion gap: 11 (ref 5–15)
BUN: 21 mg/dL — ABNORMAL HIGH (ref 6–20)
CO2: 23 mmol/L (ref 22–32)
Calcium: 8.9 mg/dL (ref 8.9–10.3)
Chloride: 104 mmol/L (ref 98–111)
Creatinine, Ser: 1.37 mg/dL — ABNORMAL HIGH (ref 0.61–1.24)
GFR calc Af Amer: >60 mL/min (ref >60)
GFR calc non Af Amer: 59 mL/min — ABNORMAL LOW (ref >60)
Glucose, Bld: 145 mg/dL — ABNORMAL HIGH (ref 70–99)
Potassium: 4.3 mmol/L (ref 3.5–5.1)
Sodium: 138 mmol/L (ref 135–145)

## 2019-02-19 LAB — POCT I-STAT 7, (LYTES, BLD GAS, ICA,H+H)
Acid-base deficit: 4 mmol/L — ABNORMAL HIGH (ref 0.0–2.0)
Bicarbonate: 19.6 mmol/L — ABNORMAL LOW (ref 20.0–28.0)
Calcium, Ion: 1.09 mmol/L — ABNORMAL LOW (ref 1.15–1.40)
HCT: 43 % (ref 39.0–52.0)
Hemoglobin: 14.6 g/dL (ref 13.0–17.0)
O2 Saturation: 94 %
Potassium: 3.6 mmol/L (ref 3.5–5.1)
Sodium: 142 mmol/L (ref 135–145)
TCO2: 21 mmol/L — ABNORMAL LOW (ref 22–32)
pCO2 arterial: 31.3 mmHg — ABNORMAL LOW (ref 32.0–48.0)
pH, Arterial: 7.404 (ref 7.350–7.450)
pO2, Arterial: 70 mmHg — ABNORMAL LOW (ref 83.0–108.0)

## 2019-02-19 LAB — POCT I-STAT EG7
Acid-base deficit: 3 mmol/L — ABNORMAL HIGH (ref 0.0–2.0)
Acid-base deficit: 5 mmol/L — ABNORMAL HIGH (ref 0.0–2.0)
Bicarbonate: 18.8 mmol/L — ABNORMAL LOW (ref 20.0–28.0)
Bicarbonate: 21.6 mmol/L (ref 20.0–28.0)
Calcium, Ion: 0.82 mmol/L — CL (ref 1.15–1.40)
Calcium, Ion: 1.14 mmol/L — ABNORMAL LOW (ref 1.15–1.40)
HCT: 38 % — ABNORMAL LOW (ref 39.0–52.0)
HCT: 44 % (ref 39.0–52.0)
Hemoglobin: 12.9 g/dL — ABNORMAL LOW (ref 13.0–17.0)
Hemoglobin: 15 g/dL (ref 13.0–17.0)
O2 Saturation: 59 %
O2 Saturation: 61 %
Potassium: 2.9 mmol/L — ABNORMAL LOW (ref 3.5–5.1)
Potassium: 3.7 mmol/L (ref 3.5–5.1)
Sodium: 141 mmol/L (ref 135–145)
Sodium: 147 mmol/L — ABNORMAL HIGH (ref 135–145)
TCO2: 20 mmol/L — ABNORMAL LOW (ref 22–32)
TCO2: 23 mmol/L (ref 22–32)
pCO2, Ven: 31.1 mmHg — ABNORMAL LOW (ref 44.0–60.0)
pCO2, Ven: 35.6 mmHg — ABNORMAL LOW (ref 44.0–60.0)
pH, Ven: 7.388 (ref 7.250–7.430)
pH, Ven: 7.391 (ref 7.250–7.430)
pO2, Ven: 31 mmHg — CL (ref 32.0–45.0)
pO2, Ven: 31 mmHg — CL (ref 32.0–45.0)

## 2019-02-19 LAB — CBC
HCT: 45.8 % (ref 39.0–52.0)
Hemoglobin: 15.4 g/dL (ref 13.0–17.0)
MCH: 32.2 pg (ref 26.0–34.0)
MCHC: 33.6 g/dL (ref 30.0–36.0)
MCV: 95.8 fL (ref 80.0–100.0)
Platelets: 309 10*3/uL (ref 150–400)
RBC: 4.78 MIL/uL (ref 4.22–5.81)
RDW: 13.8 % (ref 11.5–15.5)
WBC: 10.8 10*3/uL — ABNORMAL HIGH (ref 4.0–10.5)
nRBC: 0 % (ref 0.0–0.2)

## 2019-02-19 LAB — APTT: aPTT: 29 seconds (ref 24–36)

## 2019-02-19 LAB — HEPARIN LEVEL (UNFRACTIONATED): Heparin Unfractionated: 0.89 IU/mL — ABNORMAL HIGH (ref 0.30–0.70)

## 2019-02-19 LAB — MRSA PCR SCREENING: MRSA by PCR: NEGATIVE

## 2019-02-19 SURGERY — RIGHT/LEFT HEART CATH AND CORONARY ANGIOGRAPHY
Anesthesia: LOCAL

## 2019-02-19 MED ORDER — VERAPAMIL HCL 2.5 MG/ML IV SOLN
INTRAVENOUS | Status: AC
  Filled 2019-02-19: qty 2

## 2019-02-19 MED ORDER — SODIUM CHLORIDE 0.9% FLUSH
3.0000 mL | Freq: Two times a day (BID) | INTRAVENOUS | Status: DC
  Administered 2019-02-19 – 2019-02-25 (×10): 3 mL via INTRAVENOUS

## 2019-02-19 MED ORDER — LIDOCAINE HCL (PF) 1 % IJ SOLN
INTRAMUSCULAR | Status: AC
  Filled 2019-02-19: qty 30

## 2019-02-19 MED ORDER — AMIODARONE IV BOLUS ONLY 150 MG/100ML: 150.0000 mg | Freq: Once | INTRAVENOUS | Status: DC

## 2019-02-19 MED ORDER — SODIUM CHLORIDE 0.9% FLUSH: 3.0000 mL | INTRAVENOUS | Status: DC | PRN

## 2019-02-19 MED ORDER — HEPARIN SODIUM (PORCINE) 1000 UNIT/ML IJ SOLN
INTRAMUSCULAR | Status: AC
  Filled 2019-02-19: qty 1

## 2019-02-19 MED ORDER — AMIODARONE LOAD VIA INFUSION
150.0000 mg | Freq: Once | INTRAVENOUS | Status: AC
  Administered 2019-02-19: 150 mg via INTRAVENOUS
  Filled 2019-02-19: qty 83.34

## 2019-02-19 MED ORDER — HEPARIN SODIUM (PORCINE) 1000 UNIT/ML IJ SOLN
INTRAMUSCULAR | Status: DC | PRN
  Administered 2019-02-19: 5000 [IU] via INTRAVENOUS

## 2019-02-19 MED ORDER — HEPARIN (PORCINE) IN NACL 1000-0.9 UT/500ML-% IV SOLN
INTRAVENOUS | Status: AC
  Filled 2019-02-19: qty 1000

## 2019-02-19 MED ORDER — SPIRONOLACTONE 12.5 MG HALF TABLET
12.5000 mg | ORAL_TABLET | Freq: Every day | ORAL | Status: DC
  Administered 2019-02-19 – 2019-02-20 (×2): 12.5 mg via ORAL
  Filled 2019-02-19 (×2): qty 1

## 2019-02-19 MED ORDER — ACETAMINOPHEN 325 MG PO TABS
650.0000 mg | ORAL_TABLET | ORAL | Status: DC | PRN
  Administered 2019-02-21: 650 mg via ORAL
  Filled 2019-02-19: qty 2

## 2019-02-19 MED ORDER — IOHEXOL 350 MG/ML SOLN
INTRAVENOUS | Status: DC | PRN
  Administered 2019-02-19: 30 mL

## 2019-02-19 MED ORDER — LABETALOL HCL 5 MG/ML IV SOLN: 10.0000 mg | INTRAVENOUS | Status: AC | PRN

## 2019-02-19 MED ORDER — MIDAZOLAM HCL 2 MG/2ML IJ SOLN
INTRAMUSCULAR | Status: DC | PRN
  Administered 2019-02-19: 1 mg via INTRAVENOUS

## 2019-02-19 MED ORDER — VERAPAMIL HCL 2.5 MG/ML IV SOLN
INTRAVENOUS | Status: DC | PRN
  Administered 2019-02-19: 14:00:00 10 mL via INTRA_ARTERIAL

## 2019-02-19 MED ORDER — FENTANYL CITRATE (PF) 100 MCG/2ML IJ SOLN
INTRAMUSCULAR | Status: AC
  Filled 2019-02-19: qty 2

## 2019-02-19 MED ORDER — LIDOCAINE HCL (PF) 1 % IJ SOLN
INTRAMUSCULAR | Status: DC | PRN
  Administered 2019-02-19: 2 mL
  Administered 2019-02-19: 3 mL

## 2019-02-19 MED ORDER — ONDANSETRON HCL 4 MG/2ML IJ SOLN: 4.0000 mg | Freq: Four times a day (QID) | INTRAMUSCULAR | Status: DC | PRN

## 2019-02-19 MED ORDER — MIDAZOLAM HCL 2 MG/2ML IJ SOLN
INTRAMUSCULAR | Status: AC
  Filled 2019-02-19: qty 2

## 2019-02-19 MED ORDER — HEPARIN (PORCINE) IN NACL 1000-0.9 UT/500ML-% IV SOLN
INTRAVENOUS | Status: DC | PRN
  Administered 2019-02-19 (×2): 500 mL

## 2019-02-19 MED ORDER — FUROSEMIDE 10 MG/ML IJ SOLN
40.0000 mg | Freq: Once | INTRAMUSCULAR | Status: AC
  Administered 2019-02-19: 40 mg via INTRAVENOUS
  Filled 2019-02-19: qty 4

## 2019-02-19 MED ORDER — APIXABAN 5 MG PO TABS
5.0000 mg | ORAL_TABLET | Freq: Two times a day (BID) | ORAL | Status: DC
  Administered 2019-02-19 – 2019-02-25 (×12): 5 mg via ORAL
  Filled 2019-02-19 (×12): qty 1

## 2019-02-19 MED ORDER — HYDRALAZINE HCL 20 MG/ML IJ SOLN: 10.0000 mg | INTRAMUSCULAR | Status: AC | PRN

## 2019-02-19 MED ORDER — FENTANYL CITRATE (PF) 100 MCG/2ML IJ SOLN
INTRAMUSCULAR | Status: DC | PRN
  Administered 2019-02-19: 25 ug via INTRAVENOUS

## 2019-02-19 MED ORDER — SODIUM CHLORIDE 0.9 % IV SOLN: 250.0000 mL | INTRAVENOUS | Status: DC | PRN

## 2019-02-19 SURGICAL SUPPLY — 11 items
CATH 5FR JL3.5 JR4 ANG PIG MP (CATHETERS) ×2 IMPLANT
CATH BALLN WEDGE 5F 110CM (CATHETERS) ×2 IMPLANT
DEVICE RAD COMP TR BAND LRG (VASCULAR PRODUCTS) ×2 IMPLANT
ELECT DEFIB PAD ADLT CADENCE (PAD) ×2 IMPLANT
GLIDESHEATH SLEND SS 6F .021 (SHEATH) ×2 IMPLANT
GUIDEWIRE INQWIRE 1.5J.035X260 (WIRE) ×1 IMPLANT
INQWIRE 1.5J .035X260CM (WIRE) ×2
KIT HEART LEFT (KITS) ×2 IMPLANT
PACK CARDIAC CATHETERIZATION (CUSTOM PROCEDURE TRAY) ×2 IMPLANT
SHEATH GLIDE SLENDER 4/5FR (SHEATH) ×2 IMPLANT
TRANSDUCER W/STOPCOCK (MISCELLANEOUS) ×2 IMPLANT

## 2019-02-19 NOTE — Interval H&P Note (Signed)
History and Physical Interval Note:  02/19/2019 1:30 PM  Robert Middleton  has presented today for surgery, with the diagnosis of Heart failure.  The various methods of treatment have been discussed with the patient and family. After consideration of risks, benefits and other options for treatment, the patient has consented to  Procedure(s): RIGHT/LEFT HEART CATH AND CORONARY ANGIOGRAPHY (N/A) and possible coronary angioplasty as a surgical intervention.  The patient's history has been reviewed, patient examined, no change in status, stable for surgery.  I have reviewed the patient's chart and labs.  Questions were answered to the patient's satisfaction.     Daniel Bensimhon

## 2019-02-19 NOTE — Plan of Care (Signed)

## 2019-02-19 NOTE — Progress Notes (Signed)
Advanced Heart Failure Rounding Note  PCP-Cardiologist: Charlton Haws, MD   Subjective:    Admitted with A fib RVR. Started on amio drip. Converted to NSR this morning.   Complaining of cough. Cough started last night. Clear sputum.  Denies SOB.   Objective:   Weight Range: 114 kg Body mass index is 34.09 kg/m.   Vital Signs:   Temp:  [97.4 F (36.3 C)-98.2 F (36.8 C)] 97.4 F (36.3 C) (09/24 0716) Pulse Rate:  [89-140] 90 (09/24 0716) Resp:  [18-31] 18 (09/24 0716) BP: (108-139)/(0-119) 125/99 (09/24 0716) SpO2:  [93 %-100 %] 94 % (09/24 0716) Weight:  [114 kg-116.4 kg] 114 kg (09/24 0648) Last BM Date: 02/18/19  Weight change: Filed Weights   02/18/19 1548 02/18/19 1800 02/19/19 0648  Weight: 114.7 kg 114.7 kg 114 kg    Intake/Output:   Intake/Output Summary (Last 24 hours) at 02/19/2019 0850 Last data filed at 02/19/2019 0600 Gross per 24 hour  Intake 1004.64 ml  Output -  Net 1004.64 ml      Physical Exam    General:  Sitting in the chair.  No resp difficulty HEENT: Normal anicteric Neck: Supple. JVP .11-12  Carotids 2+ bilat; no bruits. No lymphadenopathy or thyromegaly appreciated. Cor: PMI nondisplaced. Regular rate & rhythm. No rubs, gallops or murmurs. Lungs: Clear no wheeze Abdomen: Obese Soft, nontender, nondistended. No hepatosplenomegaly. No bruits or masses. Good bowel sounds.Extremities: no cyanosis, clubbing, rash, tr  Edema warm Neuro: alert & oriented x 3, cranial nerves grossly intact. moves all 4 extremities w/o difficulty. Affect pleasant   Telemetry   Converted from A fib to NSR this morning. Personally reviewed   EKG    n/a  Labs    CBC Recent Labs    02/18/19 1135 02/19/19 0154  WBC 8.8 10.8*  HGB 14.5 15.4  HCT 43.2 45.8  MCV 97.3 95.8  PLT 294 309   Basic Metabolic Panel Recent Labs    37/48/27 1135 02/19/19 0154  NA 139 138  K 4.4 4.3  CL 108 104  CO2 24 23  GLUCOSE 129* 145*  BUN 20 21*   CREATININE 1.56* 1.37*  CALCIUM 8.9 8.9   Liver Function Tests Recent Labs    02/18/19 1135  AST 37  ALT 58*  ALKPHOS 59  BILITOT 1.0  PROT 6.8  ALBUMIN 3.4*   No results for input(s): LIPASE, AMYLASE in the last 72 hours. Cardiac Enzymes No results for input(s): CKTOTAL, CKMB, CKMBINDEX, TROPONINI in the last 72 hours.  BNP: BNP (last 3 results) Recent Labs    02/05/19 1443 02/18/19 1135  BNP 333.0* 964.5*    ProBNP (last 3 results) No results for input(s): PROBNP in the last 8760 hours.   D-Dimer No results for input(s): DDIMER in the last 72 hours. Hemoglobin A1C No results for input(s): HGBA1C in the last 72 hours. Fasting Lipid Panel No results for input(s): CHOL, HDL, LDLCALC, TRIG, CHOLHDL, LDLDIRECT in the last 72 hours. Thyroid Function Tests Recent Labs    02/18/19 1135  TSH 4.243    Other results:   Imaging    Dg Chest Port 1 View  Result Date: 02/18/2019 CLINICAL DATA:  sob EXAM: PORTABLE CHEST 1 VIEW COMPARISON:  Chest radiograph 02/05/2019 FINDINGS: Stable cardiomediastinal contours with enlarged heart size. The lungs are clear. No pneumothorax or pleural effusion. No acute finding in the visualized skeleton. IMPRESSION: Cardiomegaly.  No evidence of active disease or edema. Electronically Signed   By: Harriett Sine  Dimas Aguas M.D.   On: 02/18/2019 16:16     Medications:     Scheduled Medications: . furosemide  40 mg Intravenous Once    Infusions: . sodium chloride    . sodium chloride    . sodium chloride 10 mL/hr at 02/19/19 0642  . amiodarone 30 mg/hr (02/18/19 2200)  . heparin 1,400 Units/hr (02/19/19 0643)    PRN Medications: sodium chloride, sodium chloride, acetaminophen, ondansetron (ZOFRAN) IV   Assessment/Plan  1. Chronic Systolic HF Recently diagnosed with reduced EF. EF earlier this month 20-25%. Presumed Tachy-mediated could also have ICM given strong family history CAD.  Marland Kitchen - Volume status elevated. Give 40 mg IV  lasix now.  - Hold bb/entrest/sprio. Adjust med post cath.   2.A fib RVR Admitted with A fib RVR. Started on amio drip and converted to NSR this morning.  - Eliquis was held. Started heparin drip this morning.  Anticipate restarting eliquis post cath. Discussed with pharmacy.   3. HTN Stable today.   4. DM II Hgb A1C 6.7  - On metformin.  - Consider SGL2  5. CKD Stage III Creatinine on admit 1.56 - > 1.37  Cath today.    Length of Stay: [place], NP-C 02/19/2019, 8:50 AM  Advanced Heart Failure Team Pager [phone] (M-F; 7a - 4p)  Please contact [facility] for night-coverage after hours (4p -7a ) and weekends on [email or web address]  He has converted to NSR on IV amio but remains tenuous. + fluid overload. IV heparin started this am with Eliquis on hold for cath.   Agree with IV diuresis. Restart spiro. Watch creatinine.  Plan R/L cath later today.   Glori Bickers, MD  8:53 AM

## 2019-02-19 NOTE — H&P (View-Only) (Signed)
  Advanced Heart Failure Rounding Note  PCP-Cardiologist: Peter Nishan, MD   Subjective:    Admitted with A fib RVR. Started on amio drip. Converted to NSR this morning.   Complaining of cough. Cough started last night. Clear sputum.  Denies SOB.   Objective:   Weight Range: 114 kg Body mass index is 34.09 kg/m.   Vital Signs:   Temp:  [97.4 F (36.3 C)-98.2 F (36.8 C)] 97.4 F (36.3 C) (09/24 0716) Pulse Rate:  [89-140] 90 (09/24 0716) Resp:  [18-31] 18 (09/24 0716) BP: (108-139)/(0-119) 125/99 (09/24 0716) SpO2:  [93 %-100 %] 94 % (09/24 0716) Weight:  [114 kg-116.4 kg] 114 kg (09/24 0648) Last BM Date: 02/18/19  Weight change: Filed Weights   02/18/19 1548 02/18/19 1800 02/19/19 0648  Weight: 114.7 kg 114.7 kg 114 kg    Intake/Output:   Intake/Output Summary (Last 24 hours) at 02/19/2019 0850 Last data filed at 02/19/2019 0600 Gross per 24 hour  Intake 1004.64 ml  Output -  Net 1004.64 ml      Physical Exam    General:  Sitting in the chair.  No resp difficulty HEENT: Normal anicteric Neck: Supple. JVP .11-12  Carotids 2+ bilat; no bruits. No lymphadenopathy or thyromegaly appreciated. Cor: PMI nondisplaced. Regular rate & rhythm. No rubs, gallops or murmurs. Lungs: Clear no wheeze Abdomen: Obese Soft, nontender, nondistended. No hepatosplenomegaly. No bruits or masses. Good bowel sounds.Extremities: no cyanosis, clubbing, rash, tr  Edema warm Neuro: alert & oriented x 3, cranial nerves grossly intact. moves all 4 extremities w/o difficulty. Affect pleasant   Telemetry   Converted from A fib to NSR this morning. Personally reviewed   EKG    n/a  Labs    CBC Recent Labs    02/18/19 1135 02/19/19 0154  WBC 8.8 10.8*  HGB 14.5 15.4  HCT 43.2 45.8  MCV 97.3 95.8  PLT 294 309   Basic Metabolic Panel Recent Labs    02/18/19 1135 02/19/19 0154  NA 139 138  K 4.4 4.3  CL 108 104  CO2 24 23  GLUCOSE 129* 145*  BUN 20 21*   CREATININE 1.56* 1.37*  CALCIUM 8.9 8.9   Liver Function Tests Recent Labs    02/18/19 1135  AST 37  ALT 58*  ALKPHOS 59  BILITOT 1.0  PROT 6.8  ALBUMIN 3.4*   No results for input(s): LIPASE, AMYLASE in the last 72 hours. Cardiac Enzymes No results for input(s): CKTOTAL, CKMB, CKMBINDEX, TROPONINI in the last 72 hours.  BNP: BNP (last 3 results) Recent Labs    02/05/19 1443 02/18/19 1135  BNP 333.0* 964.5*    ProBNP (last 3 results) No results for input(s): PROBNP in the last 8760 hours.   D-Dimer No results for input(s): DDIMER in the last 72 hours. Hemoglobin A1C No results for input(s): HGBA1C in the last 72 hours. Fasting Lipid Panel No results for input(s): CHOL, HDL, LDLCALC, TRIG, CHOLHDL, LDLDIRECT in the last 72 hours. Thyroid Function Tests Recent Labs    02/18/19 1135  TSH 4.243    Other results:   Imaging    Dg Chest Port 1 View  Result Date: 02/18/2019 CLINICAL DATA:  sob EXAM: PORTABLE CHEST 1 VIEW COMPARISON:  Chest radiograph 02/05/2019 FINDINGS: Stable cardiomediastinal contours with enlarged heart size. The lungs are clear. No pneumothorax or pleural effusion. No acute finding in the visualized skeleton. IMPRESSION: Cardiomegaly.  No evidence of active disease or edema. Electronically Signed   By: Nancy    Dimas Aguas M.D.   On: 02/18/2019 16:16     Medications:     Scheduled Medications: . furosemide  40 mg Intravenous Once    Infusions: . sodium chloride    . sodium chloride    . sodium chloride 10 mL/hr at 02/19/19 0642  . amiodarone 30 mg/hr (02/18/19 2200)  . heparin 1,400 Units/hr (02/19/19 0643)    PRN Medications: sodium chloride, sodium chloride, acetaminophen, ondansetron (ZOFRAN) IV   Assessment/Plan  1. Chronic Systolic HF Recently diagnosed with reduced EF. EF earlier this month 20-25%. Presumed Tachy-mediated could also have ICM given strong family history CAD.  Marland Kitchen - Volume status elevated. Give 40 mg IV  lasix now.  - Hold bb/entrest/sprio. Adjust med post cath.   2.A fib RVR Admitted with A fib RVR. Started on amio drip and converted to NSR this morning.  - Eliquis was held. Started heparin drip this morning.  Anticipate restarting eliquis post cath. Discussed with pharmacy.   3. HTN Stable today.   4. DM II Hgb A1C 6.7  - On metformin.  - Consider SGL2  5. CKD Stage III Creatinine on admit 1.56 - > 1.37  Cath today.    Length of Stay: [place], NP-C 02/19/2019, 8:50 AM  Advanced Heart Failure Team Pager [phone] (M-F; 7a - 4p)  Please contact [facility] for night-coverage after hours (4p -7a ) and weekends on [email or web address]  He has converted to NSR on IV amio but remains tenuous. + fluid overload. IV heparin started this am with Eliquis on hold for cath.   Agree with IV diuresis. Restart spiro. Watch creatinine.  Plan R/L cath later today.   Glori Bickers, MD  8:53 AM

## 2019-02-19 NOTE — Progress Notes (Addendum)
Advanced Heart Failure Rounding Note  PCP-Cardiologist: [name], MD   Subjective:    Admitted with A fib RVR. Started on amio drip. Converted to NSR this morning.   Complaining of cough. Cough started last night. Clear sputum.  Denies SOB.   Objective:   Weight Range: 114 kg Body mass index is 34.09 kg/m.   Vital Signs:   Temp:  [97.4 F (36.3 C)-98.2 F (36.8 C)] 97.4 F (36.3 C) (09/24 0716) Pulse Rate:  [89-140] 90 (09/24 0716) Resp:  [18-31] 18 (09/24 0716) BP: (108-139)/(76-119) 125/99 (09/24 0716) SpO2:  [93 %-100 %] 94 % (09/24 0716) Weight:  [114 kg-114.7 kg] 114 kg (09/24 0648) Last BM Date: 02/18/19  Weight change: Filed Weights   02/18/19 1548 02/18/19 1800 02/19/19 0648  Weight: 114.7 kg 114.7 kg 114 kg    Intake/Output:   Intake/Output Summary (Last 24 hours) at 02/19/2019 0808 Last data filed at 02/19/2019 0600 Gross per 24 hour  Intake 1004.64 ml  Output -  Net 1004.64 ml      Physical Exam    General:  Sitting in the chair.  No resp difficulty HEENT: Normal Neck: Supple. JVP .11-12  Carotids 2+ bilat; no bruits. No lymphadenopathy or thyromegaly appreciated. Cor: PMI nondisplaced. Regular rate & rhythm. No rubs, gallops or murmurs. Lungs: Clear Abdomen: Soft, nontender, nondistended. No hepatosplenomegaly. No bruits or masses. Good bowel sounds. Extremities: No cyanosis, clubbing, rash, R and LLE trace edema Neuro: Alert & orientedx3, cranial nerves grossly intact. moves all 4 extremities w/o difficulty. Affect pleasant   Telemetry   Converted from A fib to NSR this morning.   EKG    n/a  Labs    CBC Recent Labs    02/18/19 1135 02/19/19 0154  WBC 8.8 10.8*  HGB 14.5 15.4  HCT 43.2 45.8  MCV 97.3 95.8  PLT 294 193   Basic Metabolic Panel Recent Labs    02/18/19 1135 02/19/19 0154  NA 139 138  K 4.4 4.3  CL 108 104  CO2 24 23  GLUCOSE 129* 145*  BUN 20 21*  CREATININE 1.56* 1.37*  CALCIUM 8.9 8.9    Liver Function Tests Recent Labs    02/18/19 1135  AST 37  ALT 58*  ALKPHOS 59  BILITOT 1.0  PROT 6.8  ALBUMIN 3.4*   No results for input(s): LIPASE, AMYLASE in the last 72 hours. Cardiac Enzymes No results for input(s): CKTOTAL, CKMB, CKMBINDEX, TROPONINI in the last 72 hours.  BNP: BNP (last 3 results) Recent Labs    02/05/19 1443 02/18/19 1135  BNP 333.0* 964.5*    ProBNP (last 3 results) No results for input(s): PROBNP in the last 8760 hours.   D-Dimer No results for input(s): DDIMER in the last 72 hours. Hemoglobin A1C No results for input(s): HGBA1C in the last 72 hours. Fasting Lipid Panel No results for input(s): CHOL, HDL, LDLCALC, TRIG, CHOLHDL, LDLDIRECT in the last 72 hours. Thyroid Function Tests Recent Labs    02/18/19 1135  TSH 4.243    Other results:   Imaging    Dg Chest Port 1 View  Result Date: 02/18/2019 CLINICAL DATA:  sob EXAM: PORTABLE CHEST 1 VIEW COMPARISON:  Chest radiograph 02/05/2019 FINDINGS: Stable cardiomediastinal contours with enlarged heart size. The lungs are clear. No pneumothorax or pleural effusion. No acute finding in the visualized skeleton. IMPRESSION: Cardiomegaly.  No evidence of active disease or edema. Electronically Signed   By: Audie Pinto M.D.   On:  02/18/2019 16:16      Medications:     Scheduled Medications:   Infusions: . sodium chloride    . sodium chloride    . sodium chloride 10 mL/hr at 02/19/19 0642  . amiodarone 30 mg/hr (02/18/19 2200)  . heparin 1,400 Units/hr (02/19/19 0643)     PRN Medications:  sodium chloride, sodium chloride, acetaminophen, ondansetron (ZOFRAN) IV   Assessment/Plan  1. Chronic Systolic HF Recently diagnosed with reduced EF. EF earlier this month 20-25%. Presumed Tachy-mediated could also have ICM given strong family history CAD.  Marland Kitchen - Volume status elevated. Give 40 mg IV lasix now.  - Hold bb/entrest/sprio. Adjust med post cath.   2.A fib RVR  Admitted with A fib RVR. Started on amio drip and converted to NSR this morning.  - Eliquis was held. Started heparin drip this morning.  Anticipate restarting eliquis post cath. Discussed with pharmacy.   3. HTN Stable today.   4. DM II Hgb A1C 6.7  - On metformin.  - Consider SGL2  5. CKD Stage III Creatinine on admit 1.56.   Cath today.    Length of Stay: 1  Amy Clegg, NP  02/19/2019, 8:08 AM  Advanced Heart Failure Team Pager [phone] (M-F; 7a - 4p)  Please contact [facility] for night-coverage after hours (4p -7a ) and weekends on [email or web address]  Agree with above. See my note from this morning as well.  Reuel Boom BensimhonMD

## 2019-02-19 NOTE — Progress Notes (Signed)
ANTICOAGULATION CONSULT NOTE - Follow-Up Consult  Pharmacy Consult for heparin  Indication: atrial fibrillation  No Known Allergies  Patient Measurements: Height: 6' (182.9 cm) Weight: 251 lb 5.2 oz (114 kg) IBW/kg (Calculated) : 77.6 Heparin Dosing Weight: 102kg  Vital Signs: Temp: 97.4 F (36.3 C) (09/24 0716) Temp Source: Oral (09/24 0716) BP: 125/99 (09/24 0716) Pulse Rate: 90 (09/24 0716)  Labs: Recent Labs    02/18/19 1135 02/19/19 0154 02/19/19 0157  HGB 14.5 15.4  --   HCT 43.2 45.8  --   PLT 294 309  --   APTT  --  29  --   HEPARINUNFRC  --   --  0.89*  CREATININE 1.56* 1.37*  --     Estimated Creatinine Clearance: 83.2 mL/min (A) (by C-G formula based on SCr of 1.37 mg/dL (H)).   Medical History: Past Medical History:  Diagnosis Date  . Atrial fibrillation ([place]) 01/2009  . Hypertension     Assessment: 59 yoM on apixaban PTA for PAF admitted with rapid AFib. DOAC held for heparin in anticipation of cath today. Heparin started this morning.   Goal of Therapy:  Heparin level 0.3-0.7 units/ml aPTT 66-102 seconds Monitor platelets by anticoagulation protocol: Yes   Plan:  Begin heparin infusion at 1400 units/hr Will defer heparin level with upcoming cath Likely transition back to apixaban after cath   Arrie Senate, PharmD, BCPS Clinical Pharmacist [phone] Please check AMION for all [place] numbers 02/19/2019

## 2019-02-19 NOTE — Progress Notes (Signed)
Dr. Paticia Stack paged regarding pt Afib with rate 120-140, bp 139/115.  Order for Amio bolus 150 mg over 10 minutes received and carried out.  Will continue to monitor.

## 2019-02-20 DIAGNOSIS — I4891 Unspecified atrial fibrillation: Secondary | ICD-10-CM | POA: Diagnosis not present

## 2019-02-20 DIAGNOSIS — I5022 Chronic systolic (congestive) heart failure: Secondary | ICD-10-CM | POA: Diagnosis not present

## 2019-02-20 LAB — CBC
HCT: 45.4 % (ref 39.0–52.0)
Hemoglobin: 15.6 g/dL (ref 13.0–17.0)
MCH: 32.2 pg (ref 26.0–34.0)
MCHC: 34.4 g/dL (ref 30.0–36.0)
MCV: 93.8 fL (ref 80.0–100.0)
Platelets: 274 10*3/uL (ref 150–400)
RBC: 4.84 MIL/uL (ref 4.22–5.81)
RDW: 13.6 % (ref 11.5–15.5)
WBC: 12.6 10*3/uL — ABNORMAL HIGH (ref 4.0–10.5)
nRBC: 0 % (ref 0.0–0.2)

## 2019-02-20 LAB — BASIC METABOLIC PANEL
Anion gap: 11 (ref 5–15)
BUN: 20 mg/dL (ref 6–20)
CO2: 22 mmol/L (ref 22–32)
Calcium: 8.7 mg/dL — ABNORMAL LOW (ref 8.9–10.3)
Chloride: 105 mmol/L (ref 98–111)
Creatinine, Ser: 1.46 mg/dL — ABNORMAL HIGH (ref 0.61–1.24)
GFR calc Af Amer: >60 mL/min (ref >60)
GFR calc non Af Amer: 55 mL/min — ABNORMAL LOW (ref >60)
Glucose, Bld: 120 mg/dL — ABNORMAL HIGH (ref 70–99)
Potassium: 4.2 mmol/L (ref 3.5–5.1)
Sodium: 138 mmol/L (ref 135–145)

## 2019-02-20 MED ORDER — SACUBITRIL-VALSARTAN 24-26 MG PO TABS
1.0000 | ORAL_TABLET | Freq: Two times a day (BID) | ORAL | Status: DC
  Administered 2019-02-20 – 2019-02-25 (×11): 1 via ORAL
  Filled 2019-02-20 (×11): qty 1

## 2019-02-20 MED ORDER — FUROSEMIDE 10 MG/ML IJ SOLN
40.0000 mg | Freq: Once | INTRAMUSCULAR | Status: AC
  Administered 2019-02-20: 40 mg via INTRAVENOUS
  Filled 2019-02-20: qty 4

## 2019-02-20 MED ORDER — SPIRONOLACTONE 25 MG PO TABS
25.0000 mg | ORAL_TABLET | Freq: Every day | ORAL | Status: DC
  Administered 2019-02-21 – 2019-02-25 (×5): 25 mg via ORAL
  Filled 2019-02-20 (×5): qty 1

## 2019-02-20 MED ORDER — SPIRONOLACTONE 12.5 MG HALF TABLET
12.5000 mg | ORAL_TABLET | Freq: Once | ORAL | Status: AC
  Administered 2019-02-20: 12.5 mg via ORAL
  Filled 2019-02-20: qty 1

## 2019-02-20 NOTE — TOC Progression Note (Addendum)
Transition of Care [facility]) - Progression Note    Patient Details  Name: Robert Middleton MRN: [id number] Date of Birth: August 21, 1965  Transition of Care [facility]) CM/SW Contact  Zenon Mayo, RN Phone Number: 02/20/2019, 4:52 PM  Clinical Narrative:    NCM spoke with HF clinic they will send a message to HF pharmacy to start patient ast for this patient. For entresto.  They will give him a card for 30 day free and then start the patient ast for the entresto.         Expected Discharge Plan and Services           Expected Discharge Date: 02/21/19                                     Social Determinants of Health (SDOH) Interventions    Readmission Risk Interventions No flowsheet data found.

## 2019-02-20 NOTE — Progress Notes (Addendum)
Advanced Heart Failure Rounding Note  PCP-Cardiologist: [name], MD   Subjective:    Admitted with A fib RVR. Started on amio drip. Converted to NSR .   Remains on amio drip. Yesterday (9/24) had cath with elevated filling pressures and low output. Diuresed with IV lasix. Weight down 5 pounds.   Feeling better today. Denies SOB. Still with slight cough.     Objective:   Weight Range: 112 kg Body mass index is 33.49 kg/m.   Vital Signs:   Temp:  [97.7 F (36.5 C)-98.7 F (37.1 C)] 98.7 F (37.1 C) (09/25 0734) Pulse Rate:  [82-94] 90 (09/25 0734) Resp:  [17-31] 21 (09/25 0734) BP: (96-137)/(77-107) 119/83 (09/25 0734) SpO2:  [91 %-100 %] 98 % (09/25 0734) Weight:  [578 kg] 112 kg (09/25 0638) Last BM Date: 02/18/19  Weight change: Filed Weights   02/18/19 1800 02/19/19 0648 02/20/19 4696  Weight: 114.7 kg 114 kg 112 kg    Intake/Output:   Intake/Output Summary (Last 24 hours) at 02/20/2019 1020 Last data filed at 02/20/2019 0734 Gross per 24 hour  Intake 1003.36 ml  Output -  Net 1003.36 ml      Physical Exam   General:  Well appearing. No resp difficulty HEENT: normal anicteric Neck: supple. JVP 10-11 Carotids 2+ bilat; no bruits. No lymphadenopathy or thryomegaly appreciated. Cor: PMI laterally displaced. Regular rate & rhythm. No rubs, gallops or murmurs. Lungs: clear no wheeze  Abdomen: soft, nontender, nondistended. No hepatosplenomegaly. No bruits or masses. Good bowel sounds. Extremities: no cyanosis, clubbing, rash, 1+ edema + TED hose Neuro: alert & oriented x 3, cranial nerves grossly intact. moves all 4 extremities w/o difficulty. Affect pleasant    Telemetry   NSR 80-90s Personally reviewed   EKG    n/a  Labs    CBC Recent Labs    02/19/19 0154  02/19/19 1351 02/20/19 0234  WBC 10.8*  --   --  12.6*  HGB 15.4   < > 15.0  12.9* 15.6  HCT 45.8   < > 44.0  38.0* 45.4  MCV 95.8  --   --  93.8  PLT 309  --   --  274   < > = values in this interval not displayed.   Basic Metabolic Panel Recent Labs    02/19/19 0154  02/19/19 1351 02/20/19 0234  NA 138   < > 141  147* 138  K 4.3   < > 3.7  2.9* 4.2  CL 104  --   --  105  CO2 23  --   --  22  GLUCOSE 145*  --   --  120*  BUN 21*  --   --  20  CREATININE 1.37*  --   --  1.46*  CALCIUM 8.9  --   --  8.7*   < > = values in this interval not displayed.   Liver Function Tests Recent Labs    02/18/19 1135  AST 37  ALT 58*  ALKPHOS 59  BILITOT 1.0  PROT 6.8  ALBUMIN 3.4*   No results for input(s): LIPASE, AMYLASE in the last 72 hours. Cardiac Enzymes No results for input(s): CKTOTAL, CKMB, CKMBINDEX, TROPONINI in the last 72 hours.  BNP: BNP (last 3 results) Recent Labs    02/05/19 1443 02/18/19 1135  BNP 333.0* 964.5*    ProBNP (last 3 results) No results for input(s): PROBNP in the last 8760 hours.   D-Dimer No results for  input(s): DDIMER in the last 72 hours. Hemoglobin A1C No results for input(s): HGBA1C in the last 72 hours. Fasting Lipid Panel No results for input(s): CHOL, HDL, LDLCALC, TRIG, CHOLHDL, LDLDIRECT in the last 72 hours. Thyroid Function Tests Recent Labs    02/18/19 1135  TSH 4.243    Other results:   Imaging    No results found.   Medications:     Scheduled Medications: . apixaban  5 mg Oral BID  . sodium chloride flush  3 mL Intravenous Q12H  . spironolactone  12.5 mg Oral Daily    Infusions: . sodium chloride    . amiodarone 30 mg/hr (02/19/19 1900)    PRN Medications: sodium chloride, acetaminophen, ondansetron (ZOFRAN) IV, sodium chloride flush   Assessment/Plan  1. Chronic Systolic HF Recently diagnosed with reduced EF. EF earlier this month 20-25%. Presumed Tachy-mediated could also have ICM given strong family history CAD.  Marland Kitchen - Volume status improving. Give 40 mg IV lasix  - Continue  spironolactone 12.5 mg daily - Add Entresto 24-26 mg twice a day.  - Hold bb   2.A fib RVR Admitted with A fib RVR. Started on amio drip and converted to NSR.  Maintiaining NSR. Continue IV  Amiodarone.  -Continue elquis 5 mg twice a day.    3. HTN Stable.   4. DM II Hgb A1C 6.7  - On metformin.  - Consider SGL2  5. CKD Stage III Creatinine on admit 1.56 - > 1.37->1.46   He gets his medications from [place] in [place]. He will need paper copies of his medications for discharge. I have set up follow up in the HF clinic.   Length of Stay: 2  Amy Clegg, NP-C 02/20/2019, 10:20 AM  Advanced Heart Failure Team Pager [phone] (M-F; 7a - 4p)  Please contact [facility] for night-coverage after hours (4p -7a ) and weekends on [email or web address]  Patient seen and examined with the above-signed Advanced Practice Provider and/or Housestaff. I personally reviewed laboratory data, imaging studies and relevant notes. I independently examined the patient and formulated the important aspects of the plan. I have edited the note to reflect any of my changes or salient points. I have personally discussed the plan with the patient and/or family.  Cath results reviewed with him. 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 Now back in NSR on IV amio. Will continue. Back on Eliquis. Needs more diuresis. Continue to titrate HF meds. Med regimen d/w PharmD personally. Renal function stable. K ok.   Arvilla Meres, MD  11:02 AM

## 2019-02-20 NOTE — TOC Benefit Eligibility Note (Signed)
Transition of Care [facility]) Benefit Eligibility Note    Patient Details  Name: Robert Middleton MRN: [id number] Date of Birth: 1966-03-09   Medication/Dose: Delene Loll 24-26 MG DAILY                       Additional Notes: NO PHARMACY BENEFITS ON FILE  /  MEDICAID POTENTIAL  EFF-DATE: 02-18-2019    Memory Argue Phone Number: 02/20/2019, 2:51 PM

## 2019-02-20 NOTE — Progress Notes (Signed)
Free 30 day Entresto card given to patient

## 2019-02-20 NOTE — Plan of Care (Signed)
  Problem: Clinical Measurements: Goal: Ability to maintain clinical measurements within normal limits will improve Outcome: Progressing   Problem: Clinical Measurements: Goal: Cardiovascular complication will be avoided Outcome: Progressing   Problem: Activity: Goal: Risk for activity intolerance will decrease Outcome: Progressing   Problem: Safety: Goal: Ability to remain free from injury will improve Outcome: Progressing

## 2019-02-20 NOTE — Plan of Care (Signed)
  Problem: Health Behavior/Discharge Planning: Goal: Ability to manage health-related needs will improve Outcome: Progressing   Problem: Clinical Measurements: Goal: Ability to maintain clinical measurements within normal limits will improve Outcome: Progressing Goal: Will remain free from infection Outcome: Progressing Goal: Diagnostic test results will improve Outcome: Progressing Goal: Cardiovascular complication will be avoided Outcome: Progressing   Problem: Activity: Goal: Risk for activity intolerance will decrease Outcome: Progressing   Problem: Coping: Goal: Level of anxiety will decrease Outcome: Progressing   Problem: Pain Managment: Goal: General experience of comfort will improve Outcome: Progressing   Problem: Safety: Goal: Ability to remain free from injury will improve Outcome: Progressing   Problem: Education: Goal: Ability to demonstrate management of disease process will improve Outcome: Progressing Goal: Ability to verbalize understanding of medication therapies will improve Outcome: Progressing Goal: Individualized Educational Video(s) Outcome: Progressing   Problem: Activity: Goal: Capacity to carry out activities will improve Outcome: Progressing   Problem: Cardiac: Goal: Ability to achieve and maintain adequate cardiopulmonary perfusion will improve Outcome: Progressing   

## 2019-02-21 DIAGNOSIS — I4891 Unspecified atrial fibrillation: Secondary | ICD-10-CM | POA: Diagnosis not present

## 2019-02-21 LAB — BASIC METABOLIC PANEL
Anion gap: 11 (ref 5–15)
BUN: 18 mg/dL (ref 6–20)
CO2: 21 mmol/L — ABNORMAL LOW (ref 22–32)
Calcium: 8.3 mg/dL — ABNORMAL LOW (ref 8.9–10.3)
Chloride: 104 mmol/L (ref 98–111)
Creatinine, Ser: 1.34 mg/dL — ABNORMAL HIGH (ref 0.61–1.24)
GFR calc Af Amer: >60 mL/min (ref >60)
GFR calc non Af Amer: >60 mL/min (ref >60)
Glucose, Bld: 103 mg/dL — ABNORMAL HIGH (ref 70–99)
Potassium: 3.9 mmol/L (ref 3.5–5.1)
Sodium: 136 mmol/L (ref 135–145)

## 2019-02-21 LAB — CBC
HCT: 42.9 % (ref 39.0–52.0)
Hemoglobin: 14.6 g/dL (ref 13.0–17.0)
MCH: 31.9 pg (ref 26.0–34.0)
MCHC: 34 g/dL (ref 30.0–36.0)
MCV: 93.7 fL (ref 80.0–100.0)
Platelets: 257 10*3/uL (ref 150–400)
RBC: 4.58 MIL/uL (ref 4.22–5.81)
RDW: 13.2 % (ref 11.5–15.5)
WBC: 9.7 10*3/uL (ref 4.0–10.5)
nRBC: 0 % (ref 0.0–0.2)

## 2019-02-21 NOTE — Progress Notes (Addendum)
Advanced Heart Failure Rounding Note  PCP-Cardiologist: [name], MD   Subjective:    Admitted with A fib RVR. Started on amio drip. Converted to NSR .   Remains in NSR  on amio drip. R/L cath 9/24 with normal cors, elevated filling pressures and low output.   Diuresing well with IV lasix.  Urine output slwoing down and getting darker. Walking halls. Breathing better. Orthopnea and PND resolving.  Weight down 5 pounds overnight. Creatinine improving.   SBP 95-105 this am     Objective:    Vitals:   02/20/19 2343 02/21/19 0000 02/21/19 0410 02/21/19 0754  BP: 95/62  120/83 (!) 119/54  Pulse: 76  64   Resp: (!) 21 (!) 32 14 (!) 25  Temp: 98.5 F (36.9 C)  98.1 F (36.7 C) 98 F (36.7 C)  TempSrc: Oral  Oral Oral  SpO2: 98%  98%   Weight:      Height:        Weight change: Filed Weights   02/18/19 1800 02/19/19 0648 02/20/19 0638  Weight: 114.7 kg 114 kg 112 kg    Intake/Output:   Intake/Output Summary (Last 24 hours) at 02/21/2019 0902 Last data filed at 02/21/2019 0757 Gross per 24 hour  Intake 939.43 ml  Output 950 ml  Net -10.57 ml      Physical Exam   General:  Well appearing. No resp difficulty HEENT: normal Neck: supple. no JVD. Carotids 2+ bilat; no bruits. No lymphadenopathy or thryomegaly appreciated. Cor: PMI nondisplaced. Regular rate & rhythm. + ectopy +s3 Lungs: clear Abdomen: soft, nontender, nondistended. No hepatosplenomegaly. No bruits or masses. Good bowel sounds. Extremities: no cyanosis, clubbing, rash, edema Neuro: alert & orientedx3, cranial nerves grossly intact. moves all 4 extremities w/o difficulty. Affect pleasant    Telemetry   NSR 70-80s + PVCs Personally reviewed   EKG    n/a  Labs    Results for orders placed or performed during the hospital encounter of 02/18/19 (from the past 24 hour(s))  Basic metabolic panel     Status: Abnormal   Collection Time: 02/21/19  2:36 AM  Result Value Ref Range   Sodium  136 135 - 145 mmol/L   Potassium 3.9 3.5 - 5.1 mmol/L   Chloride 104 98 - 111 mmol/L   CO2 21 (L) 22 - 32 mmol/L   Glucose, Bld 103 (H) 70 - 99 mg/dL   BUN 18 6 - 20 mg/dL   Creatinine, Ser 1.34 (H) 0.61 - 1.24 mg/dL   Calcium 8.3 (L) 8.9 - 10.3 mg/dL   GFR calc non Af Amer >60 >60 mL/min   GFR calc Af Amer >60 >60 mL/min   Anion gap 11 5 - 15  CBC     Status: None   Collection Time: 02/21/19  2:36 AM  Result Value Ref Range   WBC 9.7 4.0 - 10.5 K/uL   RBC 4.58 4.22 - 5.81 MIL/uL   Hemoglobin 14.6 13.0 - 17.0 g/dL   HCT 42.9 39.0 - 52.0 %   MCV 93.7 80.0 - 100.0 fL   MCH 31.9 26.0 - 34.0 pg   MCHC 34.0 30.0 - 36.0 g/dL   RDW 13.2 11.5 - 15.5 %   Platelets 257 150 - 400 K/uL   nRBC 0.0 0.0 - 0.2 %     No results for input(s): LIPASE, AMYLASE in the last 72 hours. Cardiac Enzymes No results for input(s): CKTOTAL, CKMB, CKMBINDEX, TROPONINI in the last 72 hours.  BNP: BNP (last 3 results) Recent Labs    02/05/19 1443 02/18/19 1135  BNP 333.0* 964.5*    ProBNP (last 3 results) No results for input(s): PROBNP in the last 8760 hours.   D-Dimer No results for input(s): DDIMER in the last 72 hours. Hemoglobin A1C No results for input(s): HGBA1C in the last 72 hours. Fasting Lipid Panel No results for input(s): CHOL, HDL, LDLCALC, TRIG, CHOLHDL, LDLDIRECT in the last 72 hours. Thyroid Function Tests Recent Labs    02/18/19 1135  TSH 4.243    Other results:   Imaging    No results found.   Medications:     . apixaban  5 mg Oral BID  . sacubitril-valsartan  1 tablet Oral BID  . sodium chloride flush  3 mL Intravenous Q12H  . spironolactone  25 mg Oral Daily    . sodium chloride    . amiodarone 30 mg/hr (02/21/19 0400)    PRN Medications: sodium chloride, acetaminophen, ondansetron (ZOFRAN) IV, sodium chloride flush   Assessment/Plan    1. Acute on chronic Systolic HF - Recently diagnosed with reduced EF. EF earlier this month 20-25%.  Presumed Tachy-mediated due to AF - Volume status back to baseline. Stop lasix. Liberalize fluid today.  - Continue spironolactone 12.5 mg daily - Continue Entresto 24-26 mg twice a day.  - Hold bb  2.A fib RVR - Admitted with A fib RVR. Started on amio drip and converted to NSR.  - Maintiaining NSR. Continue IV  Amiodarone until d/c then switch to po -Continue elquis 5 mg twice a day.   No bleeding  3. HTN -Blood pressure well controlled. Continue current regimen.  4. DM II Hgb A1C 6.7  - On metformin.  - Consider SGLT2i as outpatient  5. CKD Stage III Creatinine on admit 1.56 - > 1.37->1.46 -> 1.34  Probable d/c in am. Will need outpatient f/u with EP for possible ablation. Will also need home sleep study.   He gets his medications from [place] in [place]. He will need paper copies of his medications for discharge. I have set up follow up in the HF clinic.   Length of Stay: 3  Arvilla Meres, MD  9:05 AM

## 2019-02-21 NOTE — Progress Notes (Signed)
CARDIAC REHAB PHASE I   PRE:  Rate/Rhythm: Sinus Rhythm 68   BP:    Sitting: 103/62     SaO2: 100% RA  MODE:  Ambulation: 600 ft   POST:  Rate/Rhythem: Sinus 67  BP:    Sitting: 113/80     SaO2: 97% RA  [id number] Patient ambulated independently without complaints or shortness of breath. Intermittent short non sustained runs of triplets noted. Patient RN notified. Reviewed low sodium diet with the patient. Given atrial fibrillation booklet and CHF booklet with the patient. Patient says he is not interested in participating in phase 2 cardiac rehab. Will place phase 2 referral in case he changes his mind. Patient given brochure.Barnet Pall, RN,BSN 02/21/2019 2:37 PM   Harrell Gave

## 2019-02-22 ENCOUNTER — Encounter (HOSPITAL_COMMUNITY): Payer: Self-pay | Admitting: Cardiology

## 2019-02-22 DIAGNOSIS — I5021 Acute systolic (congestive) heart failure: Secondary | ICD-10-CM

## 2019-02-22 DIAGNOSIS — IMO0001 Reserved for inherently not codable concepts without codable children: Secondary | ICD-10-CM

## 2019-02-22 DIAGNOSIS — I428 Other cardiomyopathies: Secondary | ICD-10-CM

## 2019-02-22 LAB — CBC
HCT: 42.6 % (ref 39.0–52.0)
Hemoglobin: 15.3 g/dL (ref 13.0–17.0)
MCH: 32.6 pg (ref 26.0–34.0)
MCHC: 35.9 g/dL (ref 30.0–36.0)
MCV: 90.6 fL (ref 80.0–100.0)
Platelets: 271 10*3/uL (ref 150–400)
RBC: 4.7 MIL/uL (ref 4.22–5.81)
RDW: 12.9 % (ref 11.5–15.5)
WBC: 8.2 10*3/uL (ref 4.0–10.5)
nRBC: 0 % (ref 0.0–0.2)

## 2019-02-22 MED ORDER — AMIODARONE LOAD VIA INFUSION
150.0000 mg | Freq: Once | INTRAVENOUS | Status: AC
  Administered 2019-02-22: 12:00:00 150 mg via INTRAVENOUS
  Filled 2019-02-22: qty 83.34

## 2019-02-22 MED ORDER — AMIODARONE IV BOLUS ONLY 150 MG/100ML: 150.0000 mg | Freq: Once | INTRAVENOUS | Status: DC

## 2019-02-22 NOTE — Progress Notes (Addendum)
Advanced Heart Failure Rounding Note  PCP-Cardiologist: Charlton Haws, MD   Subjective:    Admitted with A fib RVR. Started on amio drip. Converted to NSR .   Remains in NSR  on amio drip. R/L cath 9/24 with normal cors, elevated filling pressures and low output.   Feels good. Walking halls. No SOB, orthopnea or PND. SBP back over 100 this am. Creatinine improved.     Objective:    Vitals:   02/22/19 0000 02/22/19 0400 02/22/19 0615 02/22/19 0800  BP:    (!) 98/55  Pulse:    74  Resp: (!) 21 (!) 22  20  Temp:    98 F (36.7 C)  TempSrc:    Oral  SpO2:    97%  Weight:   110.7 kg   Height:        Weight change: Filed Weights   02/19/19 0648 02/20/19 0638 02/22/19 0615  Weight: 114 kg 112 kg 110.7 kg    Intake/Output:   Intake/Output Summary (Last 24 hours) at 02/21/2019 0902 Last data filed at 02/21/2019 0757 Gross per 24 hour  Intake 939.43 ml  Output 950 ml  Net -10.57 ml      Physical Exam   General:  Well appearing. No resp difficulty HEENT: normal Neck: supple. no JVD. Carotids 2+ bilat; no bruits. No lymphadenopathy or thryomegaly appreciated. Cor: PMI nondisplaced. Regular rate & rhythm. With ectopy.  No rubs, gallops or murmurs. Lungs: clear Abdomen: soft, nontender, nondistended. No hepatosplenomegaly. No bruits or masses. Good bowel sounds. Extremities: no cyanosis, clubbing, rash, edema Neuro: alert & orientedx3, cranial nerves grossly intact. moves all 4 extremities w/o difficulty. Affect pleasant   Telemetry   NSR 70s + PVCs Personally reviewed   EKG    n/a  Labs    Results for orders placed or performed during the hospital encounter of 02/18/19 (from the past 24 hour(s))  CBC     Status: None   Collection Time: 02/22/19  2:48 AM  Result Value Ref Range   WBC 8.2 4.0 - 10.5 K/uL   RBC 4.70 4.22 - 5.81 MIL/uL   Hemoglobin 15.3 13.0 - 17.0 g/dL   HCT 94.1 74.0 - 81.4 %   MCV 90.6 80.0 - 100.0 fL   MCH 32.6 26.0 - 34.0 pg   MCHC  35.9 30.0 - 36.0 g/dL   RDW 48.1 85.6 - 31.4 %   Platelets 271 150 - 400 K/uL   nRBC 0.0 0.0 - 0.2 %     No results for input(s): LIPASE, AMYLASE in the last 72 hours. Cardiac Enzymes No results for input(s): CKTOTAL, CKMB, CKMBINDEX, TROPONINI in the last 72 hours.  BNP: BNP (last 3 results) Recent Labs    02/05/19 1443 02/18/19 1135  BNP 333.0* 964.5*    ProBNP (last 3 results) No results for input(s): PROBNP in the last 8760 hours.   D-Dimer No results for input(s): DDIMER in the last 72 hours. Hemoglobin A1C No results for input(s): HGBA1C in the last 72 hours. Fasting Lipid Panel No results for input(s): CHOL, HDL, LDLCALC, TRIG, CHOLHDL, LDLDIRECT in the last 72 hours. Thyroid Function Tests Recent Labs    02/18/19 1135  TSH 4.243    Other results:   Imaging    No results found.   Medications:     . apixaban  5 mg Oral BID  . sacubitril-valsartan  1 tablet Oral BID  . sodium chloride flush  3 mL Intravenous Q12H  .  spironolactone  25 mg Oral Daily    . sodium chloride    . amiodarone 30 mg/hr (02/22/19 0400)    PRN Medications: sodium chloride, acetaminophen, ondansetron (ZOFRAN) IV, sodium chloride flush   Assessment/Plan    1. Acute on chronic Systolic HF - New onset systolic HF earlier this month in setting of AF. EF 20-25%. Presumed Tachy-mediated due to AF - Volume status back to baseline.  - Continue spironolactone 12.5 mg daily - Continue Entresto 24-26 mg twice a day.  - Hold bb  2.A fib RVR - Admitted with A fib RVR. Started on amio drip and converted to NSR.  - Maintiaining NSR on IV amio -Continue elquis 5 mg twice a day.   No bleeding  3. HTN -Blood pressure well controlled. Continue current regimen.  4. DM II Hgb A1C 6.7  - On metformin.  - Consider SGLT2i as outpatient  5. CKD Stage III Creatinine on admit 1.56 - > 1.37->1.46 -> 1.34  OK for d/c today. Will need outpatient f/u with EP for possible  ablation. Will also need home sleep study.   He gets his medications from [place] in [place]. He will need paper copies of his medications for discharge. I have set up follow up in the HF clinic.   Meds:   Amio 200 bid Entresto 24/26 Spiro 25 daily Eliquis 5 bid Lasix 20 mg prn only Kcl 20 meq prn with lasix only Metformin 500 daily  Stop Toprol for now.   Length of Stay: 3  Glori Bickers, MD  9:05 AM   Addendum:  Patient developed recurrent PAF with RVR prior to d/c. Will resume IV amio and watch overnight.   Glori Bickers, MD  11:24 AM

## 2019-02-22 NOTE — Progress Notes (Addendum)
I went to discharge the patient but he had recently gone back in AF with RVR 120-130.  He is tolerating this well. B/P 97/70.  Discussed with Dr Haroldine Laws- keep in hospital, continue IV Amiodarone and give 150 mg bolus this am.  Kerin Ransom PA-C 02/22/2019 11:25 AM

## 2019-02-22 NOTE — Plan of Care (Signed)
  Problem: Health Behavior/Discharge Planning: Goal: Ability to manage health-related needs will improve Outcome: Progressing   Problem: Clinical Measurements: Goal: Ability to maintain clinical measurements within normal limits will improve Outcome: Progressing Goal: Will remain free from infection Outcome: Progressing Goal: Diagnostic test results will improve Outcome: Progressing Goal: Cardiovascular complication will be avoided Outcome: Progressing   Problem: Activity: Goal: Risk for activity intolerance will decrease Outcome: Progressing   

## 2019-02-22 NOTE — Progress Notes (Signed)
Pt back in AF with RVR 110-135. Asymptomatic, visiting with wife at the bedside. Paged on-call cardiology. New new orders given "we will keep him on amiodarone and watch him overnight". Oncoming nurse notified.

## 2019-02-22 NOTE — Plan of Care (Signed)
°  Problem: Clinical Measurements: °Goal: Cardiovascular complication will be avoided °Outcome: Progressing °  °Problem: Activity: °Goal: Risk for activity intolerance will decrease °Outcome: Progressing °  °Problem: Coping: °Goal: Level of anxiety will decrease °Outcome: Progressing °  °

## 2019-02-23 DIAGNOSIS — I5021 Acute systolic (congestive) heart failure: Secondary | ICD-10-CM | POA: Diagnosis not present

## 2019-02-23 DIAGNOSIS — I4819 Other persistent atrial fibrillation: Secondary | ICD-10-CM

## 2019-02-23 LAB — CBC
HCT: 45.4 % (ref 39.0–52.0)
Hemoglobin: 16.4 g/dL (ref 13.0–17.0)
MCH: 33.5 pg (ref 26.0–34.0)
MCHC: 36.1 g/dL — ABNORMAL HIGH (ref 30.0–36.0)
MCV: 92.7 fL (ref 80.0–100.0)
Platelets: 285 10*3/uL (ref 150–400)
RBC: 4.9 MIL/uL (ref 4.22–5.81)
RDW: 13.2 % (ref 11.5–15.5)
WBC: 8.7 10*3/uL (ref 4.0–10.5)
nRBC: 0 % (ref 0.0–0.2)

## 2019-02-23 LAB — BASIC METABOLIC PANEL
Anion gap: 10 (ref 5–15)
BUN: 14 mg/dL (ref 6–20)
CO2: 21 mmol/L — ABNORMAL LOW (ref 22–32)
Calcium: 8.8 mg/dL — ABNORMAL LOW (ref 8.9–10.3)
Chloride: 104 mmol/L (ref 98–111)
Creatinine, Ser: 1.44 mg/dL — ABNORMAL HIGH (ref 0.61–1.24)
GFR calc Af Amer: >60 mL/min (ref >60)
GFR calc non Af Amer: 56 mL/min — ABNORMAL LOW (ref >60)
Glucose, Bld: 135 mg/dL — ABNORMAL HIGH (ref 70–99)
Potassium: 4 mmol/L (ref 3.5–5.1)
Sodium: 135 mmol/L (ref 135–145)

## 2019-02-23 LAB — MAGNESIUM: Magnesium: 2 mg/dL (ref 1.7–2.4)

## 2019-02-23 NOTE — Progress Notes (Addendum)
Advanced Heart Failure Rounding Note  PCP-Cardiologist: Charlton Haws, MD   Subjective:    Admitted with A fib RVR.   Yesterday he was going to be discharged but went back in Afib so amio drip was restarted.   Feeling ok. Denies SOB.      Objective:    Vitals:   02/22/19 2009 02/23/19 0018 02/23/19 0352 02/23/19 0546  BP: 112/82 (!) 127/97 103/87   Pulse: 74 81 64   Resp: (!) 22 (!) 26 17   Temp: 97.8 F (36.6 C) 97.8 F (36.6 C) 97.6 F (36.4 C)   TempSrc: Oral Oral Oral   SpO2: 100% 100% 100%   Weight:    100.6 kg  Height:        Weight change: Filed Weights   02/20/19 0638 02/22/19 0615 02/23/19 0546  Weight: 112 kg 110.7 kg 100.6 kg    Intake/Output:   Intake/Output Summary (Last 24 hours) at 02/21/2019 0902 Last data filed at 02/21/2019 0757 Gross per 24 hour  Intake 939.43 ml  Output 950 ml  Net -10.57 ml      Physical Exam   General:  Well appearing. No resp difficulty. Sitting in the chair.  HEENT: normal Neck: supple. no JVD. Carotids 2+ bilat; no bruits. No lymphadenopathy or thryomegaly appreciated. Cor: PMI nondisplaced. Regular rate & rhythm. With ectopy.  No rubs, gallops or murmurs. Lungs: clear Abdomen: soft, nontender, nondistended. No hepatosplenomegaly. No bruits or masses. Good bowel sounds. Extremities: no cyanosis, clubbing, rash, edema Neuro: alert & orientedx3, cranial nerves grossly intact. moves all 4 extremities w/o difficulty. Affect pleasant   Telemetry    In and out of A fib 70s with PVCs NSVT.    EKG    n/a  Labs    Results for orders placed or performed during the hospital encounter of 02/18/19 (from the past 24 hour(s))  CBC     Status: Abnormal   Collection Time: 02/23/19  3:09 AM  Result Value Ref Range   WBC 8.7 4.0 - 10.5 K/uL   RBC 4.90 4.22 - 5.81 MIL/uL   Hemoglobin 16.4 13.0 - 17.0 g/dL   HCT 24.2 35.3 - 61.4 %   MCV 92.7 80.0 - 100.0 fL   MCH 33.5 26.0 - 34.0 pg   MCHC 36.1 (H) 30.0 - 36.0  g/dL   RDW 43.1 54.0 - 08.6 %   Platelets 285 150 - 400 K/uL   nRBC 0.0 0.0 - 0.2 %     No results for input(s): LIPASE, AMYLASE in the last 72 hours. Cardiac Enzymes No results for input(s): CKTOTAL, CKMB, CKMBINDEX, TROPONINI in the last 72 hours.  BNP: BNP (last 3 results) Recent Labs    02/05/19 1443 02/18/19 1135  BNP 333.0* 964.5*    ProBNP (last 3 results) No results for input(s): PROBNP in the last 8760 hours.   D-Dimer No results for input(s): DDIMER in the last 72 hours. Hemoglobin A1C No results for input(s): HGBA1C in the last 72 hours. Fasting Lipid Panel No results for input(s): CHOL, HDL, LDLCALC, TRIG, CHOLHDL, LDLDIRECT in the last 72 hours. Thyroid Function Tests Recent Labs    02/18/19 1135  TSH 4.243    Other results:   Imaging    No results found.   Medications:     . apixaban  5 mg Oral BID  . sacubitril-valsartan  1 tablet Oral BID  . sodium chloride flush  3 mL Intravenous Q12H  . spironolactone  25 mg  Oral Daily    . sodium chloride    . amiodarone 30 mg/hr (02/23/19 0745)    PRN Medications: sodium chloride, acetaminophen, ondansetron (ZOFRAN) IV, sodium chloride flush   Assessment/Plan    1. Acute on chronic Systolic HF - New onset systolic HF earlier this month in setting of AF. EF 20-25%. Presumed Tachy-mediated due to AF - Volume status back to baseline.  - Continue spironolactone 25 mg daily - Continue Entresto 24-26 mg twice a day.  - Hold bb - Check BMEt   2.A fib RVR - Admitted with A fib RVR. Started on amio drip and converted to NSR.  - Went back in A fib 9/27. In and out of A fib today. EP consulted  -Continue elquis 5 mg twice a day.   No bleeding  3. HTN - Controlled.   4. DM II Hgb A1C 6.7  - On metformin.  - Consider SGLT2i as outpatient  5. CKD Stage III Creatinine on admit 1.56 - > 1.37->1.46 -> 1.34-> pending   6. PVCs/NSVT On amio drip. Check K and Mag now.  - Place Life  Vest for d/c - Discussed and he is agreeable.    Darrick Grinder NP-C  9:24 AM  Patient seen and examined with Darrick Grinder, NP. We discussed all aspects of the encounter. I agree with the assessment and plan as stated above.   He has been in/out AF with frequent PVCs. Still on amio gtt. Overall AF burden much decreased. Volume status looks good. Will ask EP to see to discuss timing/need for ablation. Will need outpatient sleep study. Given high burden of PVCs will place LifeVest. Continue Eliquis.  Glori Bickers, MD  11:46 AM

## 2019-02-23 NOTE — Progress Notes (Signed)
CARDIAC REHAB PHASE I   PRE:  Rate/Rhythm: 83 afib PVCs, some bigeminy  BP:  Supine:   Sitting: 123/79  Standing:    SaO2: 99%RA  MODE:  Ambulation: 800 ft   POST:  Rate/Rhythm: 85 PVCs ?afib  BP:  Supine:   Sitting: 133/82  Standing:    SaO2: 98%RA 1400-1430 Pt walked 800 ft on RA with steady gait and tolerated well. Discussed with pt importance of daily weights, 2L FR, 2000 mg sodium restriction, walking for ex., atrial fib and wrote down how to view life vest video. Pt stated he smoked cigars but he is finished with that.  Pt stated he has read CHF booklet. Voiced understanding of ed.    Graylon Good, RN BSN  02/23/2019 2:27 PM

## 2019-02-23 NOTE — TOC Progression Note (Addendum)
Transition of Care [facility]) - Progression Note    Patient Details  Name: Robert Middleton MRN: [id number] Date of Birth: [birth year]  Transition of [facility] CM/SW Contact  Zenon Mayo, RN Phone Number: 02/23/2019, 3:43 PM  Clinical Narrative:    NCM spoke with HF clinic they will send a message to HF pharmacy to start patient ast for this patient. For entresto.  They will give him a card for 30 day free and then start the patient ast for the entresto.  NCM received call from life vest rep, NCM informed her no insurance liste on chart but contacted [facility] and they state he has insurance with the [place], he goes to [facility], His Dr. Is Dorian Heckle, social worker is Kellie Simmering [phone], her cell is [phone].    9/28  Tomi Bamberger RN BSN- NCM received call from Kellie Simmering at the [facility] stating she needs a order for the life vest to get this worked out.  NCM faxed the life vest order to her at [phone] along with the [facility] order. NCM spoke with Nira Conn she states she will work on the life vest tomorrow.  9/29 Tomi Bamberger RN, BSN - patient back into afib, will not dc today, will get life vest thru [place], NCM spoke with Claiborne Billings with [facility] and that has been taken care of .  Also he will have HHRN, [place], HHOT with [place] at dc.  NCM informed patient of this information.         Expected Discharge Plan and Services           Expected Discharge Date: 02/21/19                                     Social Determinants of Health (SDOH) Interventions    Readmission Risk Interventions No flowsheet data found.

## 2019-02-23 NOTE — Consult Note (Addendum)
Cardiology Consultation:   Patient ID: Robert Middleton MRN: [id number]; DOB: Mar 17, 1966  Admit date: 02/18/2019 Date of Consult: 02/23/2019  Primary Care Provider: Tenna Delaine, MD Primary Cardiologist: Charlton Haws, MD  Primary Electrophysiologist:  None    Patient Profile:   Robert Middleton is a 53 y.o. male with DM, a fairly new dx of HTN, smoker (tobacco and marijuana), and most recently a diagnosis of systolic HF and AFib who is being seen today for the evaluation of Paroxysmal Afib at the request of Dr. Gala Romney.  History of Present Illness:   Robert Middleton was admitted to [facility] earlier this month (02/06/2019) with CP, fatigue, palpitations, SOB, found in AFib w/RVR.  TTE noted depressed LVEF 20-25% >> TEE LVEF 20-25%, severely dilated, global hypokinesis, no clots >> DCCV on 02/09/2019.  He had elevated HS Trop (23, 22) felt to be demand in setting of RVR/CHF.  Unclear etiology of his CM though suspected to be tachy-mediated.  He was referred to CHF clinic for management seen 9.23, back in rapid AFib, feeling well  Until that morning with the onset of the palpitations and mild DOE.  He was admitted to [facility] for management.  Started on amio gtt  >> SR.  He underwent R/LHC with normal coronaries Ao = 121/97 (108) LV = 119/32 RA = 17 RV = 51/19 PA = 53/29 (40) PCW = unable to wedge Fick cardiac output/index = 4.4/1.9 PVR = 1.8 WU SVR = 1655 Ao sat = 94% PA sat = 59%, 61%  Planned for further diuresis, and suspected to have improvement in his EF with SR.  EP is asked to weigh in, with PAF w/RVR (130's) over the weekend, despite amio gtt.  Amio gtt started 02/18/2019 with bolus/load  LABS (most recent) K+ 3.9 BUN/Creat  20/1.56 >>>> 18/1.34 WBC 8.7 H/H 16/45 Plts 285  The patient feels well currently, no CP, palpitations, no SOB.  He mentions he had felt well between his discharge and visit Friday at the CHF clinic.  He felt a vague fluttering in his chest, maybe more  tired as well, but nothing overt and obvious.   Heart Pathway Score:     Past Medical History:  Diagnosis Date  . Atrial fibrillation (HCC) 01/2009  . Hypertension     Past Surgical History:  Procedure Laterality Date  . CARDIOVERSION N/A 02/09/2019   Procedure: CARDIOVERSION;  Surgeon: Wendall Stade, MD;  Location: [facility] ENDOSCOPY;  Service: Cardiovascular;  Laterality: N/A;  . RIGHT/LEFT HEART CATH AND CORONARY ANGIOGRAPHY N/A 02/19/2019   Procedure: RIGHT/LEFT HEART CATH AND CORONARY ANGIOGRAPHY;  Surgeon: Dolores Patty, MD;  Location: [facility] INVASIVE CV LAB;  Service: Cardiovascular;  Laterality: N/A;  . TEE WITHOUT CARDIOVERSION N/A 02/09/2019   Procedure: TRANSESOPHAGEAL ECHOCARDIOGRAM (TEE);  Surgeon: Wendall Stade, MD;  Location: [facility] ENDOSCOPY;  Service: Cardiovascular;  Laterality: N/A;     Home Medications:  Prior to Admission medications   Medication Sig Start Date End Date Taking? Authorizing Provider  apixaban (ELIQUIS) 5 MG TABS tablet Take 1 tablet (5 mg total) by mouth 2 (two) times daily. 02/09/19  Yes Duke, Roe Rutherford, PA  BLACK ELDERBERRY PO Take 1 tablet by mouth daily. W/ cloves and honey   Yes [provider]  metFORMIN (GLUCOPHAGE-XR) 500 MG 24 hr tablet Take 1 tablet (500 mg total) by mouth daily with breakfast. 02/10/19  Yes Duke, Roe Rutherford, PA  metoprolol succinate (TOPROL-XL) 100 MG 24 hr tablet Take 1 tablet (100 mg  total) by mouth at bedtime. Take with or immediately following a meal. 02/09/19  Yes Duke, Roe Rutherford, PA  Multiple Vitamins-Minerals (MULTIVITAMIN WITH MINERALS) tablet Take 1 tablet by mouth daily.   Yes [provider]  naproxen sodium (ANAPROX) 220 MG tablet Take 220 mg by mouth 2 (two) times daily with a meal.   Yes [provider]  nitroGLYCERIN (NITROSTAT) 0.4 MG SL tablet Place 1 tablet (0.4 mg total) under the tongue every 5 (five) minutes x 3 doses as needed for chest pain. 02/09/19  Yes Duke, Roe Rutherford, PA  sacubitril-valsartan (ENTRESTO) 24-26 MG Take 1 tablet by mouth 2 (two) times daily. 02/09/19  Yes Duke, Roe Rutherford, PA  spironolactone (ALDACTONE) 25 MG tablet Take 0.5 tablets (12.5 mg total) by mouth daily. 02/09/19  Yes Duke, Roe Rutherford, PA    Inpatient Medications: Scheduled Meds: . apixaban  5 mg Oral BID  . sacubitril-valsartan  1 tablet Oral BID  . sodium chloride flush  3 mL Intravenous Q12H  . spironolactone  25 mg Oral Daily   Continuous Infusions: . sodium chloride    . amiodarone 30 mg/hr (02/23/19 0745)   PRN Meds: sodium chloride, acetaminophen, ondansetron (ZOFRAN) IV, sodium chloride flush  Allergies:   No Known Allergies  Social History:   Social History   Socioeconomic History  . Marital status: Single    Spouse name: Not on file  . Number of children: Not on file  . Years of education: Not on file  . Highest education level: Not on file  Occupational History  . Not on file  Social Needs  . Financial resource strain: Not on file  . Food insecurity    Worry: Not on file    Inability: Not on file  . Transportation needs    Medical: Not on file    Non-medical: Not on file  Tobacco Use  . Smoking status: Current Every Day Smoker    Types: Cigars  . Smokeless tobacco: Never Used  Substance and Sexual Activity  . Alcohol use: Yes    Comment: 1 pint per week  . Drug use: Yes    Types: Marijuana  . Sexual activity: Not on file  Lifestyle  . Physical activity    Days per week: Not on file    Minutes per session: Not on file  . Stress: Not on file  Relationships  . Social Musician on phone: Not on file    Gets together: Not on file    Attends religious service: Not on file    Active member of club or organization: Not on file    Attends meetings of clubs or organizations: Not on file    Relationship status: Not on file  . Intimate partner violence    Fear of current or ex partner: Not on file    Emotionally abused:  Not on file    Physically abused: Not on file    Forced sexual activity: Not on file  Other Topics Concern  . Not on file  Social History Narrative  . Not on file    Family History:   Family History  Problem Relation Age of Onset  . Heart attack Mother 59  . CAD Mother   . Heart disease Mother   . Heart attack Brother 53     ROS:  Please see the history of present illness.  All other ROS reviewed and negative.     Physical Exam/Data:   Vitals:  02/22/19 2009 02/23/19 0018 02/23/19 0352 02/23/19 0546  BP: 112/82 (!) 127/97 103/87   Pulse: 74 81 64   Resp: (!) 22 (!) 26 17   Temp: 97.8 F (36.6 C) 97.8 F (36.6 C) 97.6 F (36.4 C)   TempSrc: Oral Oral Oral   SpO2: 100% 100% 100%   Weight:    100.6 kg  Height:        Intake/Output Summary (Last 24 hours) at 02/23/2019 1042 Last data filed at 02/22/2019 2200 Gross per 24 hour  Intake 360 ml  Output 300 ml  Net 60 ml   Last 3 Weights 02/23/2019 02/22/2019 02/20/2019  Weight (lbs) 221 lb 12.5 oz 244 lb 0.8 oz 246 lb 14.6 oz  Weight (kg) 100.6 kg 110.7 kg 112 kg     Body mass index is 30.08 kg/m.  General:  Well nourished, well developed, in no acute distress HEENT: normal Lymph: no adenopathy Neck: >10  Endocrine:  No thryomegaly Vascular: No carotid bruits  Cardiac:   RRR; extrasystoles are noted, soft SM, no gallops or rubs Lungs:  CTA b/l, no wheezing, rhonchi or rales  Abd: soft, nontender Ext:1-2+  edema Musculoskeletal:  No deformities Skin: warm and dry  Neuro:   No gross focal abnormalities noted Psych:  Normal affect   EKG:  The EKG was personally reviewed and demonstrates:    9/23; AFib 150bpm. LAD, poor anterior R progression 9/28: SR, , PVCs/couplets  01/09/2019 SR 66, LAD, poor anterior R progression, ant/lat T changes    Telemetry:  Telemetry was personally reviewed and demonstrates:   PAFib 130's with SR 70's-80's, PVCs, PACs     Relevant CV Studies:  02/19/2019; R/LHC Ao = 121/97  (108) LV = 119/32 RA = 17 RV = 51/19 PA = 53/29 (40) PCW = unable to wedge Fick cardiac output/index = 4.4/1.9 PVR = 1.8 WU SVR = 1655 Ao sat = 94% PA sat = 59%, 61%  Assessment: 1. Normal coronary arteries 2. 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 Low output  Plan/Discussion: Continue diuresis and med titration. Suspect EF will improved with maintenance of NSR.     02/09/2019: TEE/DCCV IMPRESSIONS  1. The left ventricle has severely reduced systolic function, with an ejection fraction of 20-25%. The cavity size was severely dilated. Left ventricular diastolic function could not be evaluated. Left ventrical global hypokinesis without regional wall  motion abnormalities.  2. The right ventricle has normal systolc function. The cavity was normal. There is no increase in right ventricular wall thickness.  3. Left atrial size was moderately dilated.  4. No LAA thrombus          DCC done post TEE     On eliquis     Shock x 1 150 J biphasic     Converted from afib rate 120-140 bpm     to NSR rates 55-60 bpm     No immediated neuroligc sequelae.  5. Right atrial size was moderately dilated.  6. The aortic valve is tricuspid Mild thickening of the aortic valve.  7. The aortic root is normal in size and structure.    02/06/2019: TTE IMPRESSIONS  1. The left ventricle has severely reduced systolic function, with an ejection fraction of 20-25%. The cavity size was mildly dilated. Left ventricular diastolic function could not be evaluated secondary to atrial fibrillation. Left ventrical global  hypokinesis without regional wall motion abnormalities.  2. The right ventricle has moderately reduced systolic function. The cavity was mildly enlarged.  There is no increase in right ventricular wall thickness. Right ventricular systolic pressure could not be assessed.  3. Left atrial size was moderately dilated.  4. Right atrial size was moderately dilated.  5. The aorta is not  well visualized unless otherwise noted.  6. The inferior vena cava was dilated in size with <50% respiratory variability.  7. No intracardiac thrombi or masses were visualized.  Laboratory Data:  High Sensitivity Troponin:   Recent Labs  Lab 02/05/19 1628 02/05/19 1830  TROPONINIHS 23* 22*     Chemistry Recent Labs  Lab 02/19/19 0154  02/19/19 1351 02/20/19 0234 02/21/19 0236  NA 138   < > 141  147* 138 136  K 4.3   < > 3.7  2.9* 4.2 3.9  CL 104  --   --  105 104  CO2 23  --   --  22 21*  GLUCOSE 145*  --   --  120* 103*  BUN 21*  --   --  20 18  CREATININE 1.37*  --   --  1.46* 1.34*  CALCIUM 8.9  --   --  8.7* 8.3*  GFRNONAA 59*  --   --  55* >60  GFRAA >60  --   --  >60 >60  ANIONGAP 11  --   --  11 11   < > = values in this interval not displayed.    Recent Labs  Lab 02/18/19 1135  PROT 6.8  ALBUMIN 3.4*  AST 37  ALT 58*  ALKPHOS 59  BILITOT 1.0   Hematology Recent Labs  Lab 02/21/19 0236 02/22/19 0248 02/23/19 0309  WBC 9.7 8.2 8.7  RBC 4.58 4.70 4.90  HGB 14.6 15.3 16.4  HCT 42.9 42.6 45.4  MCV 93.7 90.6 92.7  MCH 31.9 32.6 33.5  MCHC 34.0 35.9 36.1*  RDW 13.2 12.9 13.2  PLT 257 271 285   BNP Recent Labs  Lab 02/18/19 1135  BNP 964.5*    DDimer No results for input(s): DDIMER in the last 168 hours.   Radiology/Studies:  No results found.  Assessment and Plan:   1. Atrial fibrillation, paroxysmal     CHA2DS2Vasc is 2, on Eliquis, appropriately dosed  Amio started Friday, has had some PAFib w/RVR over the weekend.  AAD choices  With low output, no sotalol With CM, no 1C agents amio not a good choice for the long term in this young patient  Tikosyn  is a consideration, SR QTc looks ok on older EKG is 420ms (acceptable),  Today's though is longer 565ms (?? Amio) would need amio to wash out.  Low output failure unable to use BB/CCB for rate control currently at least  TEE described LA as mod dilated TTE also mod dilated LA,  measured 52mm, reduces ablation success    Dr. Graciela HusbandsKlein will see later today     For questions or updates, please contact [facility] Please consult [email or web address] for contact info under     Signed, Sheilah PigeonRenee Lynn Ursuy, PA-C  02/23/2019 10:42 AM   NICM  ? Duration unknown  Atrial fib persistent s/p DCCV  Heart failure-- acute/chronic>> low output  Family history of cardiac disease  Father CAD, brother sudden death--presumed MI   Pt with afib and cardiomyopathy-- it is unclear of the duration of his afib, as he says on Friday he was aware of it but had not been previously  Hx and PE above, amended to reflect my findings  Either the afib could  cause the myopathy or vice versa.  Time will tell  Agree with amio for rhythm and rate control for now--Discussed with Dr Reine Just and agree that once it becomes clear what is happening to his LV function, would consider catheter ablation of his afib substrate-- his LA size is surprisingly good, diameter is 52 but volumes are about 35  Consider dig  BB when able to tolerate  If LV function does not recover after about 3 months would consider ICD implantation  May benefit from cardiogenetics consult  Pleas Koch him to talk to his mom about HER family Emi [place]) in which there is also a strong history of heart disease  Thanks for consult

## 2019-02-23 NOTE — Plan of Care (Signed)
  Problem: Health Behavior/Discharge Planning: Goal: Ability to manage health-related needs will improve Outcome: Progressing   Problem: Clinical Measurements: Goal: Ability to maintain clinical measurements within normal limits will improve Outcome: Progressing Goal: Will remain free from infection Outcome: Progressing Goal: Diagnostic test results will improve Outcome: Progressing Goal: Cardiovascular complication will be avoided Outcome: Progressing   Problem: Activity: Goal: Risk for activity intolerance will decrease Outcome: Progressing   Problem: Coping: Goal: Level of anxiety will decrease Outcome: Progressing   Problem: Pain Managment: Goal: General experience of comfort will improve Outcome: Progressing   Problem: Safety: Goal: Ability to remain free from injury will improve Outcome: Progressing   Problem: Education: Goal: Ability to demonstrate management of disease process will improve Outcome: Progressing Goal: Ability to verbalize understanding of medication therapies will improve Outcome: Progressing Goal: Individualized Educational Video(s) Outcome: Progressing   Problem: Activity: Goal: Capacity to carry out activities will improve Outcome: Progressing   Problem: Cardiac: Goal: Ability to achieve and maintain adequate cardiopulmonary perfusion will improve Outcome: Progressing

## 2019-02-23 NOTE — Progress Notes (Signed)
  I have ordered Life Vest and faxed request.   HH ordered.   Wilbern Pennypacker Np-c  10:54 AM

## 2019-02-24 DIAGNOSIS — I4891 Unspecified atrial fibrillation: Secondary | ICD-10-CM | POA: Diagnosis not present

## 2019-02-24 MED ORDER — DIGOXIN 125 MCG PO TABS
0.1250 mg | ORAL_TABLET | Freq: Every day | ORAL | Status: DC
  Administered 2019-02-24 – 2019-02-25 (×2): 0.125 mg via ORAL
  Filled 2019-02-24 (×2): qty 1

## 2019-02-24 MED ORDER — AMIODARONE LOAD VIA INFUSION
150.0000 mg | Freq: Once | INTRAVENOUS | Status: AC
  Administered 2019-02-24: 150 mg via INTRAVENOUS
  Filled 2019-02-24: qty 83.34

## 2019-02-24 MED ORDER — DIGOXIN 125 MCG PO TABS: 0.1250 mg | ORAL_TABLET | Freq: Every day | ORAL | Status: DC

## 2019-02-24 MED ORDER — RANOLAZINE ER 500 MG PO TB12
500.0000 mg | ORAL_TABLET | Freq: Two times a day (BID) | ORAL | Status: DC
  Administered 2019-02-24 – 2019-02-25 (×3): 500 mg via ORAL
  Filled 2019-02-24 (×3): qty 1

## 2019-02-24 NOTE — Progress Notes (Addendum)
Advanced Heart Failure Rounding Note  PCP-Cardiologist: [name], MD   Subjective:    Admitted with A fib RVR.   Yesterday EP consulted due to A fib.   Back in AF with RVR as of this morning. Can feel palpitations. No CP or SOB. .      Objective:    Vitals:   02/23/19 1923 02/23/19 2343 02/24/19 0409 02/24/19 0802  BP: 113/77 100/74 114/75 96/70  Pulse: 73 66 84   Resp: (!) 21 19 14  (!) 24  Temp: 97.8 F (36.6 C) 98 F (36.7 C) 98 F (36.7 C) (!) 97.3 F (36.3 C)  TempSrc: Oral Oral Oral Oral  SpO2: 100% 98% 98%   Weight:   108.5 kg   Height:        Weight change: Filed Weights   02/22/19 0615 02/23/19 0546 02/24/19 0409  Weight: 110.7 kg 100.6 kg 108.5 kg    Intake/Output:   Intake/Output Summary (Last 24 hours) at 02/21/2019 0902 Last data filed at 02/21/2019 0757 Gross per 24 hour  Intake 939.43 ml  Output 950 ml  Net -10.57 ml      Physical Exam   General:  Sitting in the chair.  No resp difficulty HEENT: normal Neck: supple. JVP 6-7 . Carotids 2+ bilat; no bruits. No lymphadenopathy or thryomegaly appreciated. Cor: PMI nondisplaced. Irregular rate & rhythm. No rubs, gallops or murmurs. Lungs: clear Abdomen: soft, nontender, nondistended. No hepatosplenomegaly. No bruits or masses. Good bowel sounds. Extremities: no cyanosis, clubbing, rash, edema Neuro: alert & orientedx3, cranial nerves grossly intact. moves all 4 extremities w/o difficulty. Affect pleasant   Telemetry   A fib 100-->140s with PVCs.    EKG    n/a  Labs    Results for orders placed or performed during the hospital encounter of 02/18/19 (from the past 24 hour(s))  Basic metabolic panel     Status: Abnormal   Collection Time: 02/23/19 11:02 AM  Result Value Ref Range   Sodium 135 135 - 145 mmol/L   Potassium 4.0 3.5 - 5.1 mmol/L   Chloride 104 98 - 111 mmol/L   CO2 21 (L) 22 - 32 mmol/L   Glucose, Bld 135 (H) 70 - 99 mg/dL   BUN 14 6 - 20 mg/dL   Creatinine,  Ser 1.44 (H) 0.61 - 1.24 mg/dL   Calcium 8.8 (L) 8.9 - 10.3 mg/dL   GFR calc non Af Amer 56 (L) >60 mL/min   GFR calc Af Amer >60 >60 mL/min   Anion gap 10 5 - 15  Magnesium     Status: None   Collection Time: 02/23/19 11:02 AM  Result Value Ref Range   Magnesium 2.0 1.7 - 2.4 mg/dL     No results for input(s): LIPASE, AMYLASE in the last 72 hours. Cardiac Enzymes No results for input(s): CKTOTAL, CKMB, CKMBINDEX, TROPONINI in the last 72 hours.  BNP: BNP (last 3 results) Recent Labs    02/05/19 1443 02/18/19 1135  BNP 333.0* 964.5*    ProBNP (last 3 results) No results for input(s): PROBNP in the last 8760 hours.   D-Dimer No results for input(s): DDIMER in the last 72 hours. Hemoglobin A1C No results for input(s): HGBA1C in the last 72 hours. Fasting Lipid Panel No results for input(s): CHOL, HDL, LDLCALC, TRIG, CHOLHDL, LDLDIRECT in the last 72 hours. Thyroid Function Tests Recent Labs    02/18/19 1135  TSH 4.243    Other results:   Imaging  No results found.   Medications:     . apixaban  5 mg Oral BID  . sacubitril-valsartan  1 tablet Oral BID  . sodium chloride flush  3 mL Intravenous Q12H  . spironolactone  25 mg Oral Daily    . sodium chloride    . amiodarone 30 mg/hr (02/23/19 1515)    PRN Medications: sodium chloride, acetaminophen, ondansetron (ZOFRAN) IV, sodium chloride flush   Assessment/Plan    1. Acute on chronic Systolic HF - New onset systolic HF earlier this month in setting of AF. EF 20-25%. Presumed Tachy-mediated due to AF - Volume status stable.  - Add Digoxin 0.125 mg daily . (EP recommendation)  - Continue spironolactone 25 mg daily - Continue Entresto 24-26 mg twice a day.  - Hold bb with soft SBP.    2.A fib RVR - Last admit he had TEE/DC-CV on 02/09/19.  - Admitted with A fib RVR. Started on amio drip and converted to NSR.  - Went back in A fib 9/27 and has remained in A Fib  EP consulted with  recommendations to wait and see.  - Set up cardioversion. He has had 5 dose eliquis.  -Continue elquis 5 mg twice a day.   No bleeding  3. HTN - Low today.   4. DM II Hgb A1C 6.7  - On metformin.  - Consider SGLT2i as outpatient  5. CKD Stage III Creatinine on admit 1.56 - > 1.37->1.46 -> 1.34-> 1.44  6. PVCs/NSVT On amio drip. Check K and Mag now.  - Place Life Vest for d/c. Life Vest will be delivered today.  - Discussed and he is agreeable.   Set up for cardioversion.    Amy Clegg NP-C  8:26 AM  Patient seen and examined with Tonye Becket, NP. We discussed all aspects of the encounter. I agree with the assessment and plan as stated above.   Back in AF with RVR despite IV amio. Volume status ok. I d/w with Dr. Graciela Husbands. Will increase amio to 60/h and bolus 150 IV. Will Ranexa. Lifevest has been ordered. Renal function stable. Unable to d/c until we obtain solid rhythm control. Continue Eliquis. No bleeding. Discussed with PharmD personally.  Arvilla Meres, MD  9:08 AM

## 2019-02-24 NOTE — Progress Notes (Signed)
CARDIAC REHAB PHASE I   PRE:  Rate/Rhythm: 66 SR  BP:  Supine:   Sitting: 113/79  Standing:    SaO2: 98%RA  MODE:  Ambulation: 800 ft   POST:  Rate/Rhythm: 91 SR  BP:  Supine:   Sitting: 130/89  Standing:    SaO2: 99%RA 1430-1456 Pt walked 800 ft on RA and remained in NSR. Tolerated well. Gait steady.   Graylon Good, RN BSN  02/24/2019 2:51 PM

## 2019-02-25 DIAGNOSIS — I5021 Acute systolic (congestive) heart failure: Secondary | ICD-10-CM | POA: Diagnosis not present

## 2019-02-25 MED ORDER — RANOLAZINE ER 500 MG PO TB12: 500.0000 mg | ORAL_TABLET | Freq: Two times a day (BID) | ORAL | 6 refills | Status: DC

## 2019-02-25 MED ORDER — FUROSEMIDE 20 MG PO TABS: 20.0000 mg | ORAL_TABLET | ORAL | 11 refills | Status: DC | PRN

## 2019-02-25 MED ORDER — SPIRONOLACTONE 25 MG PO TABS: 25.0000 mg | ORAL_TABLET | Freq: Every day | ORAL | 3 refills | Status: DC

## 2019-02-25 MED ORDER — AMIODARONE HCL 200 MG PO TABS: 200.0000 mg | ORAL_TABLET | Freq: Two times a day (BID) | ORAL | 6 refills | Status: DC

## 2019-02-25 MED ORDER — AMIODARONE HCL 200 MG PO TABS
200.0000 mg | ORAL_TABLET | Freq: Two times a day (BID) | ORAL | Status: DC
  Administered 2019-02-25: 200 mg via ORAL
  Filled 2019-02-25: qty 1

## 2019-02-25 MED ORDER — DIGOXIN 125 MCG PO TABS: 0.1250 mg | ORAL_TABLET | Freq: Every day | ORAL | 6 refills | Status: DC

## 2019-02-25 MED FILL — DIGOXIN 0.125 MG TABLET: 125 | 30 days supply | Qty: 30 | Fill #0

## 2019-02-25 MED FILL — FUROSEMIDE 20 MG TAB: 20 | 30 days supply | Qty: 30 | Fill #0

## 2019-02-25 MED FILL — AMIODARONE HCL 200 MG TAB: 200 | 30 days supply | Qty: 60 | Fill #0

## 2019-02-25 MED FILL — RANOLAZINE ER 500 MG TABLET: 500 | 30 days supply | Qty: 60 | Fill #0

## 2019-02-25 MED FILL — SPIRONOLACTONE 25 MG TABLET: 25 | 30 days supply | Qty: 30 | Fill #0

## 2019-02-25 NOTE — Progress Notes (Signed)
Patient provided with verbal discharge instructions. Paper copy provided to patient. VSS at discharge. IV removed per order. Patient belongings sent with patient. Transition of Pharmacy brought medications to bedside.

## 2019-02-25 NOTE — Discharge Summary (Addendum)
Advanced Heart Failure Team  Discharge Summary   Patient ID: Robert Middleton MRN: [id number], DOB/AGE: 51-Sep-1967 51 y.o. Admit date: 02/18/2019 D/C date:     02/25/2019   Primary Discharge Diagnoses:  1. Acute on chronic Systolic HF 2.A fib RVR 3. HTN 4. DM II 5. CKD Stage III 6. PVCs/NSVT  Hospital Course: Robert Middleton is a 53 year old with a history of HTN, PAF, DMII, and newly diagnosed systolic heart failure.Strong family history of cardiac disease. Father and brother had MI. Father died at the age of 81 from heart complication.   Admitted in 02/06/19 A Fib RVR. ECHO performed and showed severely reduced EF. He later underwent TEE/DC-CV with restoration ofNSR.  Admitted from HF clinic with recurrent A fib RVR and mild volume overload. Had RHC/LHC with  Normal coronaries, severe NICM,  and low output HF. Reloaded on IV amio with conversion to NSR but went back in A Fib. EP consulted. Ranexa + digoxin added and he converted by to NSR. Due to frequent PVCs he had Life Vest placed. prior to discharge.   See below for detailed problem list. He will continue to be followed closely in the HF clinic. He was given paper prescriptions for HF meds. TOC Pharmacy filled ranexa, amio, digoxin, and lasix prior to discharge.   1. Acute on chronic Systolic HF - New onset systolic HF earlier this month in setting of AF. EF 20-25%. Presumed Tachy-mediated due to AF. Initially diuresed with lasix. Volume status stable at discharge.   - Continue Digoxin 0.125 mg daily .  - Continue spironolactone 25 mg daily - Continue Entresto 24-26 mg twice a day.  - Hold bb with soft SBP.  - Does not need lasix on a daily basis but will give script  20 mg lasix as needed for 3 pound weight gain.   2.A fib RVR - Last admit he had TEE/DC-CV on 02/09/19.  - Admitted with A fib RVR. Started on amio drip and converted to NSR.  - Went back in A fib 9/27 and has remained in A Fib. Reloaded on amio.  Transitioned to amio 200 mg twice a day. Started on ranexa + digoxin.  - He chemically converted to NSR on 02/25/19. EP consulted with recommendations to wait and see.  -Continue elquis 5 mg twice a day.   No bleeding.  3. HTN - Low today.   4. DM II Hgb A1C 6.7  - On metformin.  - Consider SGLT2i as outpatient  5. CKD Stage III Renal function was followed closely.   6. PVCs/NSVT On amio drip. Life Vest for d/c. Continue until repeat ECHO in 3 month.   Discharge Weight : 235 pounds.   Discharge Vitals: Blood pressure 111/84, pulse 68, temperature 98.6 F (37 C), temperature source Oral, resp. rate 13, height 6' (1.829 m), weight 107 kg, SpO2 100 %.  General:  Well appearing. No resp difficulty HEENT: normal Neck: supple. no JVD. Carotids 2+ bilat; no bruits. No lymphadenopathy or thryomegaly appreciated. Cor: PMI nondisplaced. Regular rate & rhythm. No rubs, gallops or murmurs. Lungs: clear Abdomen: soft, nontender, nondistended. No hepatosplenomegaly. No bruits or masses. Good bowel sounds. Extremities: no cyanosis, clubbing, rash, edema Neuro: alert & orientedx3, cranial nerves grossly intact. moves all 4 extremities w/o difficulty. Affect pleasant  Tele: NSR 60-70s + occasional PVCs  personally reviewed.  Labs: Lab Results  Component Value Date   WBC 8.7 02/23/2019   HGB 16.4 02/23/2019   HCT 45.4 02/23/2019  MCV 92.7 02/23/2019   PLT 285 02/23/2019    Recent Labs  Lab 02/23/19 1102  NA 135  K 4.0  CL 104  CO2 21*  BUN 14  CREATININE 1.44*  CALCIUM 8.8*  GLUCOSE 135*   Lab Results  Component Value Date   CHOL 138 02/06/2019   HDL 33 (L) 02/06/2019   LDLCALC 92 02/06/2019   TRIG 64 02/06/2019   BNP (last 3 results) Recent Labs    02/05/19 1443 02/18/19 1135  BNP 333.0* 964.5*    ProBNP (last 3 results) No results for input(s): PROBNP in the last 8760 hours.   Diagnostic Studies/Procedures   No results found.  Discharge Medications    Allergies as of 02/25/2019   No Known Allergies     Medication List    STOP taking these medications   BLACK ELDERBERRY PO   metoprolol succinate 100 MG 24 hr tablet Commonly known as: TOPROL-XL   naproxen sodium 220 MG tablet Commonly known as: ALEVE     TAKE these medications   amiodarone 200 MG tablet Commonly known as: PACERONE Take 1 tablet (200 mg total) by mouth 2 (two) times daily.   apixaban 5 MG Tabs tablet Commonly known as: ELIQUIS Take 1 tablet (5 mg total) by mouth 2 (two) times daily.   digoxin 0.125 MG tablet Commonly known as: LANOXIN Take 1 tablet (0.125 mg total) by mouth daily. Start taking on: February 26, 2019   furosemide 20 MG tablet Commonly known as: Lasix Take 1 tablet (20 mg total) by mouth as needed.   metFORMIN 500 MG 24 hr tablet Commonly known as: GLUCOPHAGE-XR Take 1 tablet (500 mg total) by mouth daily with breakfast.   multivitamin with minerals tablet Take 1 tablet by mouth daily.   nitroGLYCERIN 0.4 MG SL tablet Commonly known as: NITROSTAT Place 1 tablet (0.4 mg total) under the tongue every 5 (five) minutes x 3 doses as needed for chest pain.   ranolazine 500 MG 12 hr tablet Commonly known as: RANEXA Take 1 tablet (500 mg total) by mouth 2 (two) times daily.   sacubitril-valsartan 24-26 MG Commonly known as: ENTRESTO Take 1 tablet by mouth 2 (two) times daily.   spironolactone 25 MG tablet Commonly known as: ALDACTONE Take 1 tablet (25 mg total) by mouth daily. What changed: how much to take            Durable Medical Equipment  (From admission, onward)         Start     Ordered   02/23/19 1530  For home use only DME Vest life vest  Once     02/23/19 1529   02/23/19 1055  Heart failure home health orders  (Heart failure home health orders / Face to face)  Once    Comments: Heart Failure Follow-up Care:  Verify follow-up appointments per Patient Discharge Instructions. Confirm transportation  arranged. Reconcile home medications with discharge medication list. Remove discontinued medications from use. Assist patient/caregiver to manage medications using pill box. Reinforce low sodium food selection Assessments: Vital signs and oxygen saturation at each visit. Assess home environment for safety concerns, caregiver support and availability of low-sodium foods. Consult Education officer, museum, PT/OT, Dietitian, and CNA based on assessments. Perform comprehensive cardiopulmonary assessment. Notify MD for any change in condition or weight gain of 3 pounds in one day or 5 pounds in one week with symptoms. Daily Weights and Symptom Monitoring: Ensure patient has access to scales. Teach patient/caregiver to weigh daily before  breakfast and after voiding using same scale and record.    Teach patient/caregiver to track weight and symptoms and when to notify Provider. Activity: Develop individualized activity plan with patient/caregiver.   Question Answer Comment  Heart Failure Follow-up Care Advanced Heart Failure (AHF) Clinic at [phone]   Obtain the following labs Basic Metabolic Panel   Obtain the following labs Other see comments   Lab frequency Other see comments   Fax lab results to AHF Clinic at [phone]   Diet Low Sodium Heart Healthy   Fluid restrictions: 2000 mL Fluid      02/23/19 1055          Disposition   The patient will be discharged in stable condition to home. Discharge Instructions    (HEART FAILURE PATIENTS) Call MD:  Anytime you have any of the following symptoms: 1) 3 pound weight gain in 24 hours or 5 pounds in 1 week 2) shortness of breath, with or without a dry hacking cough 3) swelling in the hands, feet or stomach 4) if you have to sleep on extra pillows at night in order to breathe.   Complete by: As directed    Amb Referral to Cardiac Rehabilitation   Complete by: As directed    Diagnosis: Heart Failure (see criteria below if ordering Phase II)    Heart Failure Type: Chronic Systolic Comment - acute on chronic   After initial evaluation and assessments completed: Virtual Based Care may be provided alone or in conjunction with Phase 2 Cardiac Rehab based on patient barriers.: Yes   Diet - low sodium heart healthy   Complete by: As directed    Heart Failure patients record your daily weight using the same scale at the same time of day   Complete by: As directed    Increase activity slowly   Complete by: As directed      Follow-up Information    [facility] Follow up on 03/02/2019.   Specialty: Cardiology Why: 1:30 Garage Code [id number]. Bring all medications Contact information: [address] [id number] mc [place] [address] [phone]       [facility] Follow up.   Why: HHRN, HHPT, HHOT            Duration of Discharge Encounter: Greater than 35 minutes   Signed, Amy Clegg NP-C  02/25/2019, 12:16 PM   Patient seen and examined with Tonye Becket, NP. We discussed all aspects of the encounter. I agree with the assessment and plan as stated above.   Maintaining NSR on amio and Ranexa. Volume status looks good. Labs stable. Pending Lifevest placement. Ok for d/c today with close f/u in HF Clinic.   Arvilla Meres, MD  1:36 PM

## 2019-02-25 NOTE — TOC Transition Note (Signed)
Transition of Care [facility]) - CM/SW Discharge Note   Patient Details  Name: Robert Middleton MRN: [id number] Date of Birth: April 14, 1966  Transition of Care [facility]) CM/SW Contact:  Zenon Mayo, RN Phone Number: 02/25/2019, 12:48 PM   Clinical Narrative:    Patient for dc today, patient has auth for [facility] [place] [place] thru the [place] [id number] from [place] [phone]. Marland Kitchen  Patient to receive life vest today.    Final next level of care: [place] Barriers to Discharge: No Barriers Identified   Patient Goals and CMS Choice Patient states their goals for this hospitalization and ongoing recovery are:: get better CMS Medicare.gov Compare Post Acute Care list provided to:: Patient Choice offered to / list presented to : Patient  Discharge Placement                       Discharge Plan and Services                DME Arranged: Life vest DME Agency: [place] Date DME Agency Contacted: 02/24/19 Time DME Agency Contacted: 1000 Representative spoke with at DME Agency: [place]: RN, PT, OT [place] Agency: [place] ([place]) Date [place]: 02/24/19 Time [place]: 1000 Representative spoke with at [place]: [place] ([place]) Interventions     Readmission Risk Interventions No flowsheet data found.

## 2019-02-25 NOTE — Progress Notes (Signed)
[phone] Pt and wife present for review of walking instructions. Pt is interested in CRP 2 [place] if [facility] will cover. Also pt interested in APP. No questions re ed done previous visits. Pt is interested in participating in Virtual Cardiac and Pulmonary Rehab. Pt advised that Virtual Cardiac and Pulmonary Rehab is provided at no cost to the patient.  Checklist:  1. Pt has smart device  ie smartphone and/or ipad for downloading an app  Yes 2. Reliable internet/wifi service    Yes 3. Understands how to use their smartphone and navigate within an app.  Yes   Pt verbalized understanding and is in agreement.

## 2019-03-01 NOTE — Progress Notes (Signed)
ADVANCED HF CLINIC NOTE  Referring Physician: Dr Johnsie Cancel Primary Care: Primary Cardiologist: Dr Johnsie Cancel  [facility] Dr. Haroldine Laws  Reason for Visit: post hospital f/u for systolic HF and Atrial fibrillation    HPI: Robert Middleton is a 53 year old male with a history of HTN, PAF, and newly diagnosed systolic heart failure. Strong family history of cardiac disease. Father and brother had MI. Father died at the age of 53 from heart complications.   Admitted 02/06/19 w/  A Fib w/ RVR. TSH was normal. ECHO performed and showed severely reduced EF, 20-25%. He later underwent TEE/DC-CV with restoration of NSR. Started HF meds with recommendations to follow up in the HF clinic by Dr. Johnsie Cancel.  Had initial [facility] visit 9/23 and was found to be back in ERAF w/ RVR in the 150s. He was dirrectely admitted from clinic to [facility] and started on IV amiodarone. Eliquis was held in anticipation of [facility]. Started on IV heparin.    LHC 9/24 showed normal coronaries. CM felt to be tachy mediated from rapid afib.  RHC showed elevated filling pressures and low output. Fick cardiac output/index = 4.4/1.9. He was diuresed w/ IV Lasix w/ improvement in volume. He was placed on guidelines medical therapy, w/ Entresto, spironolactone, digoxin. No  blocker due to low output.   During hospitalization, he converted back to NSR w/ IV amiodarone but later reverted back to afib w/ RVR prompting EP consultation. Evaluated by Dr. Caryl Comes who recommended continuation of AAD therapy w/ amiodarone + watchful waiting.  Digoxin was added. Eliquis resumed. He ultimately converted back ton NSR by hospital d/c. If recurrent afib, may later consider Afib ablation (LA size moderately dilated). Outpatient sleep study recommended.   In addition to atrial fib, Pt was also observed to have frequent PVCs/ NSVT on tele. Electrolytes ok. Ranexa added to amiodarone. LiveVest was arranged at d/c.   Pt presents to clinic for f/u. Here with his wife. Doing well.  EKG shows NSR 65 bpm. QT/QTc ok 356/370 ms. Denies symptoms of breakthrough afib. No dyspnea, orthopnea, PND or LEE. Walking for exercise. No exertional symptoms. Volume stable. Checks weight daily. Has been going down since hospital d/c from 238 lb to 234 lb. Takes Lasix PRN. Has only had to use once since discharge. Reports full med compliance w/ other meds. No missed doses. No abnormal bleeding w/ Eliquis. Has h/o snoring.   __________________________ Cardiac Test ECHO 02/06/2019  . The left ventricle has severely reduced systolic function, with an ejection fraction of 20-25%. The cavity size was mildly dilated. Left ventricular diastolic function could not be evaluated secondary to atrial fibrillation. Left ventrical global  hypokinesis without regional wall motion abnormalities.  2. The right ventricle has moderately reduced systolic function. The cavity was mildly enlarged. There is no increase in right ventricular wall thickness. Right ventricular systolic pressure could not be assessed.  3. Left atrial size was moderately dilated.  4. Right atrial size was moderately dilated.  5. The aorta is not well visualized unless otherwise noted.  6. The inferior vena cava was dilated in size with <50% respiratory variability.  7. No intracardiac thrombi or masses were visualized.   R/LHC 02/19/19  RIGHT/LEFT HEART CATH AND CORONARY ANGIOGRAPHY  Conclusion  Findings:  Ao = 121/97 (108) LV = 119/32 RA = 17 RV = 51/19 PA = 53/29 (40) PCW = unable to wedge Fick cardiac output/index = 4.4/1.9 PVR = 1.8 WU SVR = 1655 Ao sat = 94% PA sat =  59%, 61%  Assessment:  1. Normal coronary arteries 2. 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 Low output  Plan/Discussion:  Continue diuresis and med titration. Suspect EF will improved with maintenance of NSR.   Arvilla Meresaniel Bensimhon, MD  2:08 PM     FH:  Father had MI at age 51 requiring CABG.  Died of cardiac complication at age 51.  Brother MI at age 51. Brother has diabetes.   Review of Systems: [y] = yes, [ ]  = no   General: Weight gain [ ] ; Weight loss [ ] ; Anorexia [ ] ; Fatigue [Y ]; Fever [ ] ; Chills [ ] ; Weakness [Y ]  Cardiac: Chest pain/pressure [ ] ; Resting SOB [ ] ; Exertional SOB [Y ]; Orthopnea [ ] ; Pedal Edema [ ] ; Palpitations [ ] ; Syncope [ ] ; Presyncope [ ] ; Paroxysmal nocturnal dyspnea[ ]   Pulmonary: Cough [ ] ; Wheezing[ ] ; Hemoptysis[ ] ; Sputum [ ] ; Snoring [ ]   GI: Vomiting[ ] ; Dysphagia[ ] ; Melena[ ] ; Hematochezia [ ] ; Heartburn[ ] ; Abdominal pain [ ] ; Constipation [ ] ; Diarrhea [ ] ; BRBPR [ ]   GU: Hematuria[ ] ; Dysuria [ ] ; Nocturia[ ]   Vascular: Pain in legs with walking [ ] ; Pain in feet with lying flat [ ] ; Non-healing sores [ ] ; Stroke [ ] ; TIA [ ] ; Slurred speech [ ] ;  Neuro: Headaches[ ] ; Vertigo[ ] ; Seizures[ ] ; Paresthesias[ ] ;Blurred vision [ ] ; Diplopia [ ] ; Vision changes [ ]   Ortho/Skin: Arthritis [ ] ; Joint pain [ ] ; Muscle pain [ ] ; Joint swelling [ ] ; Back Pain [Y]; Rash [ ]   Psych: Depression[ ] ; Anxiety[ ]   Heme: Bleeding problems [ ] ; Clotting disorders [ ] ; Anemia [ ]   Endocrine: Diabetes [ ] ; Thyroid dysfunction[ ]    Past Medical History:  Diagnosis Date  . Atrial fibrillation (HCC) 01/2009  . Hypertension     Current Outpatient Medications  Medication Sig Dispense Refill  . amiodarone (PACERONE) 200 MG tablet Take 1 tablet (200 mg total) by mouth 2 (two) times daily. 60 tablet 6  . apixaban (ELIQUIS) 5 MG TABS tablet Take 1 tablet (5 mg total) by mouth 2 (two) times daily. 180 tablet 3  . digoxin (LANOXIN) 0.125 MG tablet Take 1 tablet (0.125 mg total) by mouth daily. 30 tablet 6  . furosemide (LASIX) 20 MG tablet Take 1 tablet (20 mg total) by mouth as needed. 30 tablet 11  . metFORMIN (GLUCOPHAGE-XR) 500 MG 24 hr tablet Take 1 tablet (500 mg total) by mouth daily with breakfast. 90 tablet 3  . Multiple Vitamins-Minerals (MULTIVITAMIN WITH MINERALS) tablet Take 1 tablet  by mouth daily.    . ranolazine (RANEXA) 500 MG 12 hr tablet Take 1 tablet (500 mg total) by mouth 2 (two) times daily. 60 tablet 6  . sacubitril-valsartan (ENTRESTO) 24-26 MG Take 1 tablet by mouth 2 (two) times daily. 60 tablet 3  . spironolactone (ALDACTONE) 25 MG tablet Take 1 tablet (25 mg total) by mouth daily. 30 tablet 3  . nitroGLYCERIN (NITROSTAT) 0.4 MG SL tablet Place 1 tablet (0.4 mg total) under the tongue every 5 (five) minutes x 3 doses as needed for chest pain. (Patient not taking: Reported on 03/02/2019) 25 tablet 3   No current facility-administered medications for this encounter.     No Known Allergies    Social History   Socioeconomic History  . Marital status: Single    Spouse name: Not on file  . Number of children: Not on file  . Years of education:  Not on file  . Highest education level: Not on file  Occupational History  . Not on file  Social Needs  . Financial resource strain: Not on file  . Food insecurity    Worry: Not on file    Inability: Not on file  . Transportation needs    Medical: Not on file    Non-medical: Not on file  Tobacco Use  . Smoking status: Former Smoker    Types: Cigars    Quit date: 02/18/2019    Years since quitting: 0.0  . Smokeless tobacco: Never Used  Substance and Sexual Activity  . Alcohol use: Yes    Comment: 1 pint per week  . Drug use: Yes    Types: Marijuana  . Sexual activity: Not on file  Lifestyle  . Physical activity    Days per week: Not on file    Minutes per session: Not on file  . Stress: Not on file  Relationships  . Social Musician on phone: Not on file    Gets together: Not on file    Attends religious service: Not on file    Active member of club or organization: Not on file    Attends meetings of clubs or organizations: Not on file    Relationship status: Not on file  . Intimate partner violence    Fear of current or ex partner: Not on file    Emotionally abused: Not on file     Physically abused: Not on file    Forced sexual activity: Not on file  Other Topics Concern  . Not on file  Social History Narrative  . Not on file      Family History  Problem Relation Age of Onset  . Heart attack Mother 35  . CAD Mother   . Heart disease Mother   . Heart attack Brother 53    Vitals:   03/02/19 1337  BP: (!) 128/98  Pulse: 71  SpO2: 99%  Weight: 108.1 kg (238 lb 6.4 oz)   Doppler BP 110   PHYSICAL EXAM: PHYSICAL EXAM: General:  Well appearing. No respiratory difficulty HEENT: normal Neck: supple. no JVD. Carotids 2+ bilat; no bruits. No lymphadenopathy or thyromegaly appreciated. Cor: PMI nondisplaced. Regular rate & rhythm. No rubs, gallops or murmurs. Lungs: clear Abdomen: soft, nontender, nondistended. No hepatosplenomegaly. No bruits or masses. Good bowel sounds. Extremities: no cyanosis, clubbing, rash, edema Neuro: alert & oriented x 3, cranial nerves grossly intact. moves all 4 extremities w/o difficulty. Affect pleasant.  ECG: NSR 65 bpm   ASSESSMENT & PLAN: 1. Chronic Systolic HF /NICM: Recently diagnosed. Echo 02/06/19 with reduced EF, 20-25%. LHC 02/19/19 showed normal coronaries. Presumed Tachy-mediated from rapid atrial fibrillation. RHC showed elevated filling pressures and low output. Fick cardiac output/index = 4.4/1.9.  Diuresed w/ IV lasix during hospitalization w/ improvement in volume. -Stable. NYHA Functional Class II symptoms -Volume stable on exam. No edema. Weight continues to trend down since admission -Continue Lasix only PRN based on daily weights. Pt given dosing instructions. Take Lasix if > 3 lb gain in 24 hr or if > 5 lb in 1 week - low salt diet  -Check BMP today -If renal function and K stable, plan to increase Entresto to 49-51 -Continue Spironolactone 25 mg daily -Continue digoxin 0.125 mg daily -no  blocker due to low output  -Once maximized on medical therapy, will need f/u echo in 3 months to reassess LVEF. If EF  remains <  35%, will refer to EP for ICD   2. Atrial Fibrillation: s/p DCCV 01/2019 with ERAF, requiring initiation of AAD therapy w/ Amiodarone. Maintaining NSR. HR in the 60s. No symptoms of breakthrough afib.  -Continue amiodarone 200 mg bid x 7 more days>>>then reduce dose to 200 mg once daily starting 10/12. -Continue Digoxin 0.125 -Continue Eliquis 5 mg bid for a/c. Denies abnormal bleeding but will check a f/u CBC today -Order outpatient sleep study  3. NSVT/PVCs: In the setting of low EF and absence of significant electrolyte abnormalties. No coronary ischemia. LHC 01/2019 showed no CAD - Has Lifevest given EF <35%. Has been compliant w/ vest. No shocks.  - Continue Amiodarone, 200 mg bid until 10/12 then reduce down to once daily - Continue Ranexa 500 mg bid (QT/QTc stable) - Continue Medical management of systolic HF - Plan repeat 2D echo after 3 months of maximum tolerated guidelines directed medical therapy -If EF remains <35%, will refer back to EF for ICD -Continue LifeVest for primary prevention    3. HTN:  diastolic pressure elevated at 390/30. -Check BMP today and plan to increase Entresto to 49-51 if stable -Continue spironolactone  -He will continue to monitor pressure closely at home.  -discussed low sodium diet  4.T2DM: Hgb A1c 6.7 - on Metformin -consider addition of an SGLT2i at next f/u visit   F/u in 2-3 weeks for further titration of HF mediation regimen.   Jahira Swiss NP-C  1:51 PM

## 2019-03-02 ENCOUNTER — Encounter (HOSPITAL_COMMUNITY): Payer: Self-pay

## 2019-03-02 ENCOUNTER — Ambulatory Visit (HOSPITAL_COMMUNITY)
Admission: RE | Admit: 2019-03-02 | Discharge: 2019-03-02 | Disposition: A | Discharge status: Home / Self Care | Payer: Self-pay | Source: Ambulatory Visit | Attending: Adult Health | Admitting: Adult Health

## 2019-03-02 ENCOUNTER — Other Ambulatory Visit: Payer: Self-pay

## 2019-03-02 VITALS — BP 128/98 | HR 71 | Wt 238.4 lb

## 2019-03-02 DIAGNOSIS — Z8249 Family history of ischemic heart disease and other diseases of the circulatory system: Secondary | ICD-10-CM | POA: Insufficient documentation

## 2019-03-02 DIAGNOSIS — Z7901 Long term (current) use of anticoagulants: Secondary | ICD-10-CM | POA: Insufficient documentation

## 2019-03-02 DIAGNOSIS — I493 Ventricular premature depolarization: Secondary | ICD-10-CM | POA: Insufficient documentation

## 2019-03-02 DIAGNOSIS — Z87891 Personal history of nicotine dependence: Secondary | ICD-10-CM | POA: Insufficient documentation

## 2019-03-02 DIAGNOSIS — E119 Type 2 diabetes mellitus without complications: Secondary | ICD-10-CM | POA: Insufficient documentation

## 2019-03-02 DIAGNOSIS — I428 Other cardiomyopathies: Secondary | ICD-10-CM | POA: Insufficient documentation

## 2019-03-02 DIAGNOSIS — I10 Essential (primary) hypertension: Secondary | ICD-10-CM

## 2019-03-02 DIAGNOSIS — I444 Left anterior fascicular block: Secondary | ICD-10-CM | POA: Insufficient documentation

## 2019-03-02 DIAGNOSIS — Z9889 Other specified postprocedural states: Secondary | ICD-10-CM | POA: Insufficient documentation

## 2019-03-02 DIAGNOSIS — I5022 Chronic systolic (congestive) heart failure: Secondary | ICD-10-CM | POA: Insufficient documentation

## 2019-03-02 DIAGNOSIS — I110 Hypertensive heart disease with heart failure: Secondary | ICD-10-CM | POA: Insufficient documentation

## 2019-03-02 DIAGNOSIS — I472 Ventricular tachycardia: Secondary | ICD-10-CM | POA: Insufficient documentation

## 2019-03-02 DIAGNOSIS — I480 Paroxysmal atrial fibrillation: Secondary | ICD-10-CM | POA: Insufficient documentation

## 2019-03-02 DIAGNOSIS — Z7984 Long term (current) use of oral hypoglycemic drugs: Secondary | ICD-10-CM | POA: Insufficient documentation

## 2019-03-02 DIAGNOSIS — Z79899 Other long term (current) drug therapy: Secondary | ICD-10-CM | POA: Insufficient documentation

## 2019-03-02 LAB — CBC
HCT: 50.8 % (ref 39.0–52.0)
Hemoglobin: 17.3 g/dL — ABNORMAL HIGH (ref 13.0–17.0)
MCH: 32.3 pg (ref 26.0–34.0)
MCHC: 34.1 g/dL (ref 30.0–36.0)
MCV: 94.8 fL (ref 80.0–100.0)
Platelets: 304 10*3/uL (ref 150–400)
RBC: 5.36 MIL/uL (ref 4.22–5.81)
RDW: 13.2 % (ref 11.5–15.5)
WBC: 6.5 10*3/uL (ref 4.0–10.5)
nRBC: 0 % (ref 0.0–0.2)

## 2019-03-02 LAB — BASIC METABOLIC PANEL
Anion gap: 13 (ref 5–15)
BUN: 20 mg/dL (ref 6–20)
CO2: 18 mmol/L — ABNORMAL LOW (ref 22–32)
Calcium: 9.1 mg/dL (ref 8.9–10.3)
Chloride: 106 mmol/L (ref 98–111)
Creatinine, Ser: 1.38 mg/dL — ABNORMAL HIGH (ref 0.61–1.24)
GFR calc Af Amer: >60 mL/min (ref >60)
GFR calc non Af Amer: 59 mL/min — ABNORMAL LOW (ref >60)
Glucose, Bld: 92 mg/dL (ref 70–99)
Potassium: 4.8 mmol/L (ref 3.5–5.1)
Sodium: 137 mmol/L (ref 135–145)

## 2019-03-02 MED ORDER — SACUBITRIL-VALSARTAN 49-51 MG PO TABS: 1.0000 | ORAL_TABLET | Freq: Two times a day (BID) | ORAL | 11 refills | Status: DC

## 2019-03-02 NOTE — Patient Instructions (Signed)
Lab work done today. We will notify you of any abnormal lab work. No news is good news!  EKG done today.  INCREASE Entresto to 49/51mg  dose. One tab two times daily.  CONTINUE Amiodarone 200mg  two times daily for 7 more days ONLY. THEN on October 12th START Amiodarone 200mg  daily.  Please follow up with the Advance Heart Failure Clinic in 2-3 weeks.  At the [facility], you and your health needs are our priority. As part of our continuing mission to provide you with exceptional heart care, we have created designated Provider Care Teams. These Care Teams include your primary Cardiologist (physician) and Advanced Practice Providers (APPs- Physician Assistants and Nurse Practitioners) who all work together to provide you with the care you need, when you need it.   You may see any of the following providers on your designated Care Team at your next follow up: Marland Kitchen Dr Glori Bickers . Dr Loralie Champagne . Darrick Grinder, NP   Please be sure to bring in all your medications bottles to every appointment.

## 2019-03-02 NOTE — Progress Notes (Signed)
Samples of Entresto given. 49/51mg  dose. Lot #: [id number]. Expiration: 10/2020. 1 bottle given until benefits with [facility] confirmed.

## 2019-03-11 ENCOUNTER — Telehealth (HOSPITAL_COMMUNITY): Payer: Self-pay

## 2019-03-11 NOTE — Telephone Encounter (Signed)
Received call from [facility] asking for renewal of PT orders for patient.  LM on her vm to return call to office.

## 2019-03-13 ENCOUNTER — Telehealth (HOSPITAL_COMMUNITY): Payer: Self-pay | Admitting: Cardiology

## 2019-03-13 NOTE — Telephone Encounter (Signed)
  I personally called him. He denies dizziness/ shortness of breath.   He reports brown bowel movement with small amount of blood on toilet paper x2 . No recent colonoscopy.   Recent hgb stable. For now continue current plan. Instructed to call back if bleeding continues.    Starlee Corralejo NP-C Advanced Heart Failure Clinic

## 2019-03-13 NOTE — Telephone Encounter (Signed)
Robert Middleton,PT with [facility] called to report that patient had c/o blood in stool and increased itching at night  Spoke with patient directly and bloodly stools are still present. Denies hemoptysis or nose bleeds.   Advised patient I would forward to provider since he is on a blood thinner although most recent labs are stable and to review HF medications for side effects that may include itching.   aldo advised if things are all clear from a HF perspective patient would need to follow up with PCP for further evaluation. Pt voiced understanding.

## 2019-03-20 ENCOUNTER — Ambulatory Visit: Payer: Non-veteran care | Admitting: Cardiovascular Disease

## 2019-03-25 ENCOUNTER — Encounter (HOSPITAL_COMMUNITY): Payer: Self-pay | Admitting: Internal Medicine

## 2019-03-25 ENCOUNTER — Ambulatory Visit (HOSPITAL_COMMUNITY)
Admission: RE | Admit: 2019-03-25 | Discharge: 2019-03-25 | Disposition: A | Discharge status: Home / Self Care | Payer: Non-veteran care | Source: Ambulatory Visit | Attending: Internal Medicine | Admitting: Internal Medicine

## 2019-03-25 ENCOUNTER — Other Ambulatory Visit: Payer: Self-pay

## 2019-03-25 VITALS — BP 112/63 | HR 69 | Wt 245.6 lb

## 2019-03-25 DIAGNOSIS — I5022 Chronic systolic (congestive) heart failure: Secondary | ICD-10-CM | POA: Insufficient documentation

## 2019-03-25 DIAGNOSIS — I480 Paroxysmal atrial fibrillation: Secondary | ICD-10-CM | POA: Diagnosis not present

## 2019-03-25 LAB — BASIC METABOLIC PANEL
Anion gap: 11 (ref 5–15)
BUN: 16 mg/dL (ref 6–20)
CO2: 24 mmol/L (ref 22–32)
Calcium: 9.1 mg/dL (ref 8.9–10.3)
Chloride: 104 mmol/L (ref 98–111)
Creatinine, Ser: 1.2 mg/dL (ref 0.61–1.24)
GFR calc Af Amer: >60 mL/min (ref >60)
GFR calc non Af Amer: >60 mL/min (ref >60)
Glucose, Bld: 162 mg/dL — ABNORMAL HIGH (ref 70–99)
Potassium: 3.9 mmol/L (ref 3.5–5.1)
Sodium: 139 mmol/L (ref 135–145)

## 2019-03-25 LAB — BRAIN NATRIURETIC PEPTIDE: B Natriuretic Peptide: 11.9 pg/mL (ref 0.0–100.0)

## 2019-03-25 MED ORDER — RANOLAZINE ER 500 MG PO TB12: 500.0000 mg | ORAL_TABLET | Freq: Two times a day (BID) | ORAL | 3 refills | Status: DC

## 2019-03-25 MED ORDER — JARDIANCE 25 MG PO TABS: 12.5000 mg | ORAL_TABLET | Freq: Every day | ORAL | Status: AC

## 2019-03-25 MED ORDER — AMIODARONE HCL 200 MG PO TABS: 200.0000 mg | ORAL_TABLET | Freq: Two times a day (BID) | ORAL | 3 refills | Status: DC

## 2019-03-25 MED ORDER — SACUBITRIL-VALSARTAN 49-51 MG PO TABS: 1.0000 | ORAL_TABLET | Freq: Two times a day (BID) | ORAL | 3 refills | Status: DC

## 2019-03-25 MED ORDER — SPIRONOLACTONE 25 MG PO TABS: 25.0000 mg | ORAL_TABLET | Freq: Every day | ORAL | 3 refills | Status: DC

## 2019-03-25 MED ORDER — DIGOXIN 125 MCG PO TABS: 0.1250 mg | ORAL_TABLET | Freq: Every day | ORAL | 3 refills | Status: DC

## 2019-03-25 MED ORDER — ROSUVASTATIN CALCIUM 20 MG PO TABS: 10.0000 mg | ORAL_TABLET | Freq: Every day | ORAL | 3 refills | Status: DC

## 2019-03-25 MED ORDER — FUROSEMIDE 20 MG PO TABS: 20.0000 mg | ORAL_TABLET | ORAL | 3 refills | Status: DC | PRN

## 2019-03-25 NOTE — Patient Instructions (Signed)
It was great to see you today! No medication changes are needed at this time.  Labs today We will only contact you if something comes back abnormal or we need to make some changes. Otherwise no news is good news!  Your physician recommends that you schedule a follow-up appointment in: 2-3 months with Dr Haroldine Laws and echo  Your physician has requested that you have an echocardiogram. Echocardiography is a painless test that uses sound waves to create images of your heart. It provides your doctor with information about the size and shape of your heart and how well your heart's chambers and valves are working. This procedure takes approximately one hour. There are no restrictions for this procedure.  Do the following things EVERYDAY: 1) Weigh yourself in the morning before breakfast. Write it down and keep it in a log. 2) Take your medicines as prescribed 3) Eat low salt foods-Limit salt (sodium) to 2000 mg per day.  4) Stay as active as you can everyday 5) Limit all fluids for the day to less than 2 liters  At the [facility], you and your health needs are our priority. As part of our continuing mission to provide you with exceptional heart care, we have created designated Provider Care Teams. These Care Teams include your primary Cardiologist (physician) and Advanced Practice Providers (APPs- Physician Assistants and Nurse Practitioners) who all work together to provide you with the care you need, when you need it.   You may see any of the following providers on your designated Care Team at your next follow up: Marland Kitchen Dr Glori Bickers . Dr Loralie Champagne . Darrick Grinder, NP . Lyda Jester, PA   Please be sure to bring in all your medications bottles to every appointment.

## 2019-03-25 NOTE — Progress Notes (Signed)
ADVANCED HF CLINIC NOTE  Referring Physician: Dr Eden Emms Primary Care: Primary Cardiologist: Dr Eden Emms  [facility]: Dr. Gala Romney    HPI: Mr Robledo is a 53 year old male with a history of HTN, PAF, and newly diagnosed systolic heart failure. Strong family history of cardiac disease. Father and brother had MI. Father died at the age of 55 from heart complications.   Admitted 02/06/19 w/  A Fib w/ RVR. TSH was normal. ECHO performed and showed severely reduced EF, 20-25%. He later underwent TEE/DC-CV with restoration of NSR. Started HF meds with recommendations to follow up in the HF clinic by Dr. Eden Emms.  Had initial [facility] visit 9/23 and was found to be back in AF w/ RVR in the 150s with worsening HF. He was dirrectely admitted from clinic to [facility] and started on IV amiodarone. Eliquis was held in anticipation of [facility]. Started on IV heparin.    LHC 9/24 showed normal coronaries. CM felt to be tachy mediated from rapid afib.  RHC showed elevated filling pressures and low output. Fick cardiac output/index = 4.4/1.9. He was diuresed w/ IV Lasix w/ improvement in volume. He was placed on guidelines medical therapy, w/ Entresto, spironolactone, digoxin. No  blocker due to low output.   During hospitalization, he converted back to NSR w/ IV amiodarone but later reverted back to afib w/ RVR prompting EP consultation. Evaluated by Dr. Graciela Husbands who recommended continuation of AAD therapy w/ amiodarone + watchful waiting.  Digoxin was added. Eliquis resumed. He ultimately converted back to NSR by hospital d/c. If recurrent afib, may later consider Afib ablation (LA size moderately dilated). Outpatient sleep study recommended. In addition to atrial fib, Pt was also observed to have frequent PVCs/ NSVT on tele. Electrolytes ok. Ranexa added to amiodarone. LiveVest was arranged at d/c.   Pt presents to clinic for f/u. Says he feels much better. Wearing LifeVest. No palpitations. Walking every day for 1 mile at a time  and then doing 15 mins of band exercises. No SOB, orthopnea or PND. No edema. Compliant with med. Taking Eliquis. Mild spotting on toilet paper. No melena. Taking lasix every 3-4 days as needed.   __________________________ Cardiac Test ECHO 02/06/2019  . The left ventricle has severely reduced systolic function, with an ejection fraction of 20-25%. The cavity size was mildly dilated. Left ventricular diastolic function could not be evaluated secondary to atrial fibrillation. Left ventrical global  hypokinesis without regional wall motion abnormalities.  2. The right ventricle has moderately reduced systolic function. The cavity was mildly enlarged. There is no increase in right ventricular wall thickness. Right ventricular systolic pressure could not be assessed.  3. Left atrial size was moderately dilated.  4. Right atrial size was moderately dilated.  5. The aorta is not well visualized unless otherwise noted.  6. The inferior vena cava was dilated in size with <50% respiratory variability.  7. No intracardiac thrombi or masses were visualized.   R/LHC 02/19/19  RIGHT/LEFT HEART CATH AND CORONARY ANGIOGRAPHY  Conclusion  Findings:  Ao = 121/97 (108) LV = 119/32 RA = 17 RV = 51/19 PA = 53/29 (40) PCW = unable to wedge Fick cardiac output/index = 4.4/1.9 PVR = 1.8 WU SVR = 1655 Ao sat = 94% PA sat = 59%, 61%  Assessment:  1. Normal coronary arteries 2. 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 Low output  Plan/Discussion:  Continue diuresis and med titration. Suspect EF will improved with maintenance of NSR.   Arvilla Meres,  MD  2:08 PM     FH:  Father had MI at age 49 requiring CABG.  Died of cardiac complication at age 78. Brother MI at age 7. Brother has diabetes.   Past Medical History:  Diagnosis Date  . Atrial fibrillation (HCC) 01/2009  . Hypertension     Current Outpatient Medications  Medication Sig Dispense Refill  . amiodarone  (PACERONE) 200 MG tablet Take 1 tablet (200 mg total) by mouth 2 (two) times daily. 60 tablet 6  . apixaban (ELIQUIS) 5 MG TABS tablet Take 1 tablet (5 mg total) by mouth 2 (two) times daily. 180 tablet 3  . digoxin (LANOXIN) 0.125 MG tablet Take 1 tablet (0.125 mg total) by mouth daily. 30 tablet 6  . furosemide (LASIX) 20 MG tablet Take 1 tablet (20 mg total) by mouth as needed. 30 tablet 11  . metFORMIN (GLUCOPHAGE-XR) 500 MG 24 hr tablet Take 1 tablet (500 mg total) by mouth daily with breakfast. 90 tablet 3  . Multiple Vitamins-Minerals (MULTIVITAMIN WITH MINERALS) tablet Take 1 tablet by mouth daily.    . nitroGLYCERIN (NITROSTAT) 0.4 MG SL tablet Place 1 tablet (0.4 mg total) under the tongue every 5 (five) minutes x 3 doses as needed for chest pain. 25 tablet 3  . ranolazine (RANEXA) 500 MG 12 hr tablet Take 1 tablet (500 mg total) by mouth 2 (two) times daily. 60 tablet 6  . sacubitril-valsartan (ENTRESTO) 49-51 MG Take 1 tablet by mouth 2 (two) times daily. 60 tablet 11  . spironolactone (ALDACTONE) 25 MG tablet Take 1 tablet (25 mg total) by mouth daily. 30 tablet 3   No current facility-administered medications for this encounter.     No Known Allergies    Social History   Socioeconomic History  . Marital status: Single    Spouse name: Not on file  . Number of children: Not on file  . Years of education: Not on file  . Highest education level: Not on file  Occupational History  . Not on file  Social Needs  . Financial resource strain: Not on file  . Food insecurity    Worry: Not on file    Inability: Not on file  . Transportation needs    Medical: Not on file    Non-medical: Not on file  Tobacco Use  . Smoking status: Former Smoker    Types: Cigars    Quit date: 02/18/2019    Years since quitting: 0.0  . Smokeless tobacco: Never Used  Substance and Sexual Activity  . Alcohol use: Yes    Comment: 1 pint per week  . Drug use: Yes    Types: Marijuana  . Sexual  activity: Not on file  Lifestyle  . Physical activity    Days per week: Not on file    Minutes per session: Not on file  . Stress: Not on file  Relationships  . Social Musician on phone: Not on file    Gets together: Not on file    Attends religious service: Not on file    Active member of club or organization: Not on file    Attends meetings of clubs or organizations: Not on file    Relationship status: Not on file  . Intimate partner violence    Fear of current or ex partner: Not on file    Emotionally abused: Not on file    Physically abused: Not on file    Forced sexual activity: Not  on file  Other Topics Concern  . Not on file  Social History Narrative  . Not on file      Family History  Problem Relation Age of Onset  . Heart attack Mother 68  . CAD Mother   . Heart disease Mother   . Heart attack Brother 53    Vitals:   03/25/19 1503  BP: 112/63  Pulse: 69  SpO2: 97%  Weight: 111.4 kg (245 lb 9.6 oz)    PHYSICAL EXAM: General:  Well appearing. No resp difficulty HEENT: normal Neck: supple. no JVD. Carotids 2+ bilat; no bruits. No lymphadenopathy or thryomegaly appreciated. Cor: PMI nondisplaced. Regular rate & rhythm. No rubs, gallops or murmurs. Lungs: clear Abdomen: soft, nontender, nondistended. No hepatosplenomegaly. No bruits or masses. Good bowel sounds. Extremities: no cyanosis, clubbing, rash, edema Neuro: alert & orientedx3, cranial nerves grossly intact. moves all 4 extremities w/o difficulty. Affect pleasant LifeVest in place   ECG: NSR 65 bpm Personally reviewed  ASSESSMENT & PLAN: 1. Chronic Systolic HF /NICM: Recently diagnosed. Echo 02/06/19 with reduced EF, 20-25%. LHC 02/19/19 showed normal coronaries. Presumed Tachy-mediated from rapid atrial fibrillation. RHC showed elevated filling pressures and low output. Fick cardiac output/index = 4.4/1.9.  Diuresed w/ IV lasix during hospitalization w/ improvement in volume. - Much  improved today NYHA II - Volume looks good - Continue Entresto 49/51 bid (recently increased) - Continue spiro 25 - Continue digoxin 0.125 - Lasix prn only  - No  blocker yet due to low output  - Continue LifeVest - Once maximized on medical therapy, will need f/u echo in 3 months to reassess LVEF. If EF remains < 35%, will refer to EP for ICD - Labs today  2. Atrial Fibrillation: s/p DCCV 01/2019 with ERAF, requiring initiation of AAD therapy w/ Amiodarone. Maintaining NSR. HR in the 60s. No symptoms of breakthrough afib.  - Remains in NSR - Continue amio 200 daily - Continue Ranexa 500 bid - Continue Eliquis 5 mg bid for a/c. No bleeding - Pending outpatient sleep study - Once EF recovers will need to consider AF ablation. EP has seen  3. NSVT/PVCs: In the setting of low EF and absence of significant electrolyte abnormalties. No coronary ischemia. [place] 01/2019 showed no CAD - Has Lifevest given EF <35%. Has been compliant w/ vest. No shocks.  - Continue Amiodarone, 200 mg daily - Check labs  3. HTN:   - Improved  4.T2DM: Hgb A1c 6.7 - on Metformin - consider addition of an SGLT2i at next f/u visit   Glori Bickers, MD  1:09 PM

## 2019-03-31 ENCOUNTER — Other Ambulatory Visit (HOSPITAL_COMMUNITY): Payer: Self-pay | Admitting: *Deleted

## 2019-03-31 ENCOUNTER — Telehealth (HOSPITAL_COMMUNITY): Payer: Self-pay

## 2019-03-31 MED ORDER — DIGOXIN 125 MCG PO TABS: 0.1250 mg | ORAL_TABLET | Freq: Every day | ORAL | 3 refills | Status: DC

## 2019-03-31 MED ORDER — SPIRONOLACTONE 25 MG PO TABS: 25.0000 mg | ORAL_TABLET | Freq: Every day | ORAL | 3 refills | Status: DC

## 2019-03-31 MED ORDER — ROSUVASTATIN CALCIUM 20 MG PO TABS: 10.0000 mg | ORAL_TABLET | Freq: Every day | ORAL | 3 refills | Status: DC

## 2019-03-31 MED ORDER — AMIODARONE HCL 200 MG PO TABS: 200.0000 mg | ORAL_TABLET | Freq: Two times a day (BID) | ORAL | 3 refills | Status: DC

## 2019-03-31 MED ORDER — SACUBITRIL-VALSARTAN 49-51 MG PO TABS: 1.0000 | ORAL_TABLET | Freq: Two times a day (BID) | ORAL | 3 refills | Status: DC

## 2019-03-31 MED ORDER — APIXABAN 5 MG PO TABS: 5.0000 mg | ORAL_TABLET | Freq: Two times a day (BID) | ORAL | 3 refills | Status: DC

## 2019-03-31 MED ORDER — FUROSEMIDE 20 MG PO TABS: 20.0000 mg | ORAL_TABLET | ORAL | 3 refills | Status: DC | PRN

## 2019-03-31 NOTE — Telephone Encounter (Signed)
Patient walked in requesting samples for his medication. His [facility] Provider was out of the office today to refill his medication. Gave the patient 1 bottle of  Entresto 49/51. He will call the office tomorrow to let us know if he needs anymore assistance.

## 2019-04-17 ENCOUNTER — Telehealth: Payer: Self-pay | Admitting: Cardiovascular Disease

## 2019-04-17 NOTE — Telephone Encounter (Signed)
Spoke w/Courtney, she has completed a form since pt is a English as a second language teacher and just needs it to be signed by MD, Dorian Pod our rep will bring the form by the office on Tue for signature.

## 2019-04-17 NOTE — Telephone Encounter (Signed)
New Message  Courtney from [employer] (company for [employer]) is calling in wanting to speak with a nurse to go over patient's paperwork that needs to be filled out due to him being a veteran. Please give Loma Sousa a call back at [phone].

## 2019-04-21 ENCOUNTER — Telehealth (HOSPITAL_COMMUNITY): Payer: Self-pay

## 2019-04-21 NOTE — Telephone Encounter (Signed)
Angel from [facility] called to report that the patients weight has increased from 232.6lbs(03/04/2019) to 241.1lbs(11/19). Glenard Haring states that even though this wasn't a weight gain in 3-5 days they still have to report it due to it being over 237lbs. She did report however that the patient is taking all medications as prescribed. No other symptoms were reported.   Patients next appointment is scheduled for 06/29/2019.   Angel CB# [phone]

## 2019-04-22 NOTE — Telephone Encounter (Signed)
Pt has PRN Lasix, per Dr Haroldine Laws he would like pt to take that a couple of days if he hasn't already.  Have attempted to call pt several times today and Left message to call back

## 2019-04-28 ENCOUNTER — Encounter: Payer: Self-pay | Admitting: Neurology

## 2019-04-28 ENCOUNTER — Ambulatory Visit (INDEPENDENT_AMBULATORY_CARE_PROVIDER_SITE_OTHER): Payer: No Typology Code available for payment source | Admitting: Neurology

## 2019-04-28 ENCOUNTER — Other Ambulatory Visit: Payer: Self-pay

## 2019-04-28 VITALS — BP 109/70 | HR 59 | Temp 96.8°F | Ht 72.0 in | Wt 255.4 lb

## 2019-04-28 DIAGNOSIS — I5020 Unspecified systolic (congestive) heart failure: Secondary | ICD-10-CM | POA: Diagnosis not present

## 2019-04-28 DIAGNOSIS — E669 Obesity, unspecified: Secondary | ICD-10-CM | POA: Diagnosis not present

## 2019-04-28 DIAGNOSIS — R0683 Snoring: Secondary | ICD-10-CM | POA: Diagnosis not present

## 2019-04-28 DIAGNOSIS — R351 Nocturia: Secondary | ICD-10-CM

## 2019-04-28 DIAGNOSIS — I4891 Unspecified atrial fibrillation: Secondary | ICD-10-CM

## 2019-04-28 DIAGNOSIS — R0681 Apnea, not elsewhere classified: Secondary | ICD-10-CM

## 2019-04-28 DIAGNOSIS — G4719 Other hypersomnia: Secondary | ICD-10-CM

## 2019-04-28 DIAGNOSIS — Z820 Family history of epilepsy and other diseases of the nervous system: Secondary | ICD-10-CM

## 2019-04-28 NOTE — Patient Instructions (Signed)
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
## 2019-06-11 ENCOUNTER — Ambulatory Visit (INDEPENDENT_AMBULATORY_CARE_PROVIDER_SITE_OTHER): Payer: No Typology Code available for payment source | Admitting: Neurology

## 2019-06-11 ENCOUNTER — Other Ambulatory Visit: Payer: Self-pay

## 2019-06-11 DIAGNOSIS — R351 Nocturia: Secondary | ICD-10-CM

## 2019-06-11 DIAGNOSIS — R0683 Snoring: Secondary | ICD-10-CM

## 2019-06-11 DIAGNOSIS — Z820 Family history of epilepsy and other diseases of the nervous system: Secondary | ICD-10-CM

## 2019-06-11 DIAGNOSIS — G4733 Obstructive sleep apnea (adult) (pediatric): Secondary | ICD-10-CM | POA: Diagnosis not present

## 2019-06-11 DIAGNOSIS — I4891 Unspecified atrial fibrillation: Secondary | ICD-10-CM

## 2019-06-11 DIAGNOSIS — E669 Obesity, unspecified: Secondary | ICD-10-CM

## 2019-06-11 DIAGNOSIS — I5020 Unspecified systolic (congestive) heart failure: Secondary | ICD-10-CM

## 2019-06-11 DIAGNOSIS — G4719 Other hypersomnia: Secondary | ICD-10-CM

## 2019-06-11 DIAGNOSIS — R0681 Apnea, not elsewhere classified: Secondary | ICD-10-CM

## 2019-06-11 DIAGNOSIS — G4720 Circadian rhythm sleep disorder, unspecified type: Secondary | ICD-10-CM

## 2019-06-23 ENCOUNTER — Telehealth: Payer: Self-pay

## 2019-06-23 NOTE — Telephone Encounter (Signed)
Lvm asking pt to call back so we could review sleep study results.  

## 2019-06-23 NOTE — Addendum Note (Signed)
Addended by: Huston Foley on: 06/23/2019 08:00 AM   Modules accepted: Orders

## 2019-06-23 NOTE — Telephone Encounter (Signed)
-----   Message from Huston Foley, MD sent at 06/23/2019  8:00 AM EST ----- Patient referred by Dr. Jamey Reas, seen by me on 04/28/19, diagnostic PSG on 05/1419.    Please call and notify the patient that the recent sleep study did confirm the diagnosis of obstructive sleep apnea. OSA is overall mild, but in the severe range, when he is in REM/dream sleep and given his new Dx of of afib, his OSA is worth treating to see if he feels better after treatment. To that end I recommend treatment for this in the form of autoPAP, which means, that we don't have to bring him back for a second sleep study with CPAP, but will let him try an autoPAP machine at home, through a DME company (of his choice, or as per insurance requirement). The DME representative will educate him on how to use the machine, how to put the mask on, etc. I have placed an order in the chart. Please send referral, talk to patient, send report to referring MD. We will need a FU in sleep clinic for 10 weeks post-PAP set up, please arrange that with me or one of our NPs. Thanks,   Huston Foley, MD, PhD [facility])

## 2019-06-23 NOTE — Procedures (Signed)
PATIENT'S NAME:  Robert Middleton, Robert Middleton DOB:      24-Feb-1966      MR#:    [id number]     DATE OF RECORDING: 06/11/2019 REFERRING M.D.:  Mikki Harbor, MD Study Performed:   Baseline Polysomnogram HISTORY: 54 year old right-handed gentleman with an underlying medical history of hypertension, atrial fibrillation, systolic congestive heart failure and obesity, who reports snoring and excessive daytime somnolence.  His wife has noted apneic pauses while he is asleep. The patient endorsed the Epworth Sleepiness Scale at 17 points. The patient's weight 255 pounds with a height of 72 (inches), resulting in a BMI of 34.6 kg/m2. The patient's neck circumference measured 17.2 inches.  CURRENT MEDICATIONS: Pacerone, Eliquis, Lanoxin, Jardiance, Lasix, Multivitamin, Nitrostat, Crestor, Entresto, Aldactone   PROCEDURE:  This is a multichannel digital polysomnogram utilizing the Somnostar 11.2 system.  Electrodes and sensors were applied and monitored per AASM Specifications.   EEG, EOG, Chin and Limb EMG, were sampled at 200 Hz.  ECG, Snore and Nasal Pressure, Thermal Airflow, Respiratory Effort, CPAP Flow and Pressure, Oximetry was sampled at 50 Hz. Digital video and audio were recorded.      BASELINE STUDY  Lights Out was at 21:49 and Lights On at 04:38.  Total recording time (TRT) was 409.5 minutes, with a total sleep time (TST) of 284.5 minutes.   The patient's sleep latency was 101 minutes, which is delayed. REM latency was 188.5 minutes, which is delayed. The sleep efficiency was 69.5%, which is reduced.     SLEEP ARCHITECTURE: WASO (Wake after sleep onset) was 89.5 minutes with mild to moderate sleep fragmentation noted. There were 51 minutes in Stage N1, 192.5 minutes Stage N2, 0.5 minutes Stage N3 and 40.5 minutes in Stage REM.  The percentage of Stage N1 was 17.9%, which is markedly increased, Stage N2 was 67.7%, which is increased, Stage N3 was .2% and Stage R (REM sleep) was 14.2%, which is reduced. The  arousals were noted as: 49 were spontaneous, 1 were associated with PLMs, 7 were associated with respiratory events.  RESPIRATORY ANALYSIS:  There were a total of 52 respiratory events: 35 obstructive apneas, 5 central apneas and 6 mixed apneas with a total of 46 apneas and an apnea index (AI) of 9.7 /hour. There were 6 hypopneas with a hypopnea index of 1.3 /hour. The patient also had 0 respiratory event related arousals (RERAs).      The total APNEA/HYPOPNEA INDEX (AHI) was 11./hour and the total RESPIRATORY DISTURBANCE INDEX was  11. /hour.  33 events occurred in REM sleep and 21 events in NREM. The REM AHI was  48.9 /hour, versus a non-REM AHI of 4.7. The patient spent 237.5 minutes of total sleep time in the supine position and 47 minutes in non-supine.. The supine AHI was 13.1 versus a non-supine AHI of 0.0.   OXYGEN SATURATION & C02:  The Wake baseline 02 saturation was 98%, with the lowest being 81%. Time spent below 89% saturation equaled 11 minutes. PERIODIC LIMB MOVEMENTS: The patient had a total of 8 Periodic Limb Movements.  The Periodic Limb Movement (PLM) index was 1.7 and the PLM Arousal index was .2/hour.  Audio and video analysis did not show any abnormal or unusual movements, behaviors, phonations or vocalizations. The patient took bathroom breaks. Mild to moderate snoring was noted. The EKG was in keeping with normal sinus rhythm (NSR).  Post-study, the patient indicated that sleep was better than usual.   IMPRESSION:  1. Obstructive Sleep Apnea (OSA) 2. Dysfunctions associated  with sleep stages or arousal from sleep  RECOMMENDATIONS:  1. This study demonstrates overall mild obstructive sleep apnea, severe in REM sleep with a total AHI of 11/hour, REM AHI of 48.9/hour, and O2 nadir of 81%. Given the patient's medical history, especially, new diagnosis of atrial fibrillation and sleep related complaints (daytime somnolence), treatment with positive airway pressure is  recommended; this can be achieved in the form of autoPAP. Alternatively, a full-night CPAP titration study would allow optimization of therapy if needed. Other treatment options may include avoidance of supine sleep position along with weight loss, upper airway or jaw surgery in selected patients or the use of an oral appliance in certain patients. ENT evaluation and/or consultation with a maxillofacial surgeon or dentist may be feasible in some instances.    2. Please note that untreated obstructive sleep apnea may carry additional perioperative morbidity. Patients with significant obstructive sleep apnea should receive perioperative PAP therapy and the surgeons and particularly the anesthesiologist should be informed of the diagnosis and the severity of the sleep disordered breathing. 3. This study shows sleep fragmentation and abnormal sleep stage percentages; these are nonspecific findings and per se do not signify an intrinsic sleep disorder or a cause for the patient's sleep-related symptoms. Causes include (but are not limited to) the first night effect of the sleep study, circadian rhythm disturbances, medication effect or an underlying mood disorder or medical problem.  4. The patient should be cautioned not to drive, work at heights, or operate dangerous or heavy equipment when tired or sleepy. Review and reiteration of good sleep hygiene measures should be pursued with any patient. 5. The patient will be seen in follow-up by Dr. Frances Furbish at [facility] for discussion of the test results and further management strategies. The referring provider will be notified of the test results.  I certify that I have reviewed the entire raw data recording prior to the issuance of this report in accordance with the Standards of Accreditation of the American Academy of Sleep Medicine (AASM)   Huston Foley, MD, PhD Diplomat, American Board of Neurology and Sleep Medicine (Neurology and Sleep Medicine)

## 2019-06-23 NOTE — Progress Notes (Signed)
Patient referred by Dr. Jamey Reas, seen by me on 04/28/19, diagnostic PSG on 05/1419.    Please call and notify the patient that the recent sleep study did confirm the diagnosis of obstructive sleep apnea. OSA is overall mild, but in the severe range, when he is in REM/dream sleep and given his new Dx of of afib, his OSA is worth treating to see if he feels better after treatment. To that end I recommend treatment for this in the form of autoPAP, which means, that we don't have to bring him back for a second sleep study with CPAP, but will let him try an autoPAP machine at home, through a DME company (of his choice, or as per insurance requirement). The DME representative will educate him on how to use the machine, how to put the mask on, etc. I have placed an order in the chart. Please send referral, talk to patient, send report to referring MD. We will need a FU in sleep clinic for 10 weeks post-PAP set up, please arrange that with me or one of our NPs. Thanks,   Huston Foley, MD, PhD [facility])

## 2019-06-25 NOTE — Telephone Encounter (Signed)
I left second vm  Asking for him to call back so we could review results.   I have also mailed a letter asking the pt to call since I have been able to get in touch with him.

## 2019-06-25 NOTE — Telephone Encounter (Signed)
Error

## 2019-06-29 ENCOUNTER — Encounter (HOSPITAL_COMMUNITY): Payer: Non-veteran care | Admitting: Internal Medicine

## 2019-06-29 ENCOUNTER — Ambulatory Visit (HOSPITAL_COMMUNITY): Payer: No Typology Code available for payment source
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
## 2019-08-21 ENCOUNTER — Ambulatory Visit (INDEPENDENT_AMBULATORY_CARE_PROVIDER_SITE_OTHER): Payer: No Typology Code available for payment source | Admitting: Cardiology

## 2019-08-21 ENCOUNTER — Encounter: Payer: Self-pay | Admitting: Cardiology

## 2019-08-21 ENCOUNTER — Other Ambulatory Visit: Payer: Self-pay

## 2019-08-21 VITALS — BP 140/86 | HR 53 | Temp 97.3°F | Ht 71.5 in | Wt 255.4 lb

## 2019-08-21 DIAGNOSIS — I5022 Chronic systolic (congestive) heart failure: Secondary | ICD-10-CM | POA: Diagnosis not present

## 2019-08-21 DIAGNOSIS — I10 Essential (primary) hypertension: Secondary | ICD-10-CM | POA: Diagnosis not present

## 2019-08-21 DIAGNOSIS — I480 Paroxysmal atrial fibrillation: Secondary | ICD-10-CM | POA: Diagnosis not present

## 2019-08-21 DIAGNOSIS — I472 Ventricular tachycardia: Secondary | ICD-10-CM

## 2019-08-21 DIAGNOSIS — I4729 Other ventricular tachycardia: Secondary | ICD-10-CM

## 2019-08-21 NOTE — Patient Instructions (Signed)
Medication Instructions:  The current medical regimen is effective;  continue present plan and medications.  *If you need a refill on your cardiac medications before your next appointment, please call your pharmacy*   Follow-Up: At [facility], you and your health needs are our priority.  As part of our continuing mission to provide you with exceptional heart care, we have created designated Provider Care Teams.  These Care Teams include your primary Cardiologist (physician) and Advanced Practice Providers (APPs -  Physician Assistants and Nurse Practitioners) who all work together to provide you with the care you need, when you need it.  We recommend signing up for the patient portal called "MyChart".  Sign up information is provided on this After Visit Summary.  MyChart is used to connect with patients for Virtual Visits (Telemedicine).  Patients are able to view lab/test results, encounter notes, upcoming appointments, etc.  Non-urgent messages can be sent to your provider as well.   To learn more about what you can do with MyChart, go to [email or web address].    Your next appointment:   [facility]  Follow up with EP [facility] Doctor.   Other Instructions Referral to EP

## 2019-08-24 ENCOUNTER — Telehealth: Payer: Self-pay | Admitting: Cardiology

## 2019-08-24 NOTE — Telephone Encounter (Signed)
New Message    Maralyn Sago is calling from the [place] requesting office notes from the last appt on 03/26/021   Fax # [phone]

## 2019-08-24 NOTE — Telephone Encounter (Signed)
[facility]

## 2019-09-07 ENCOUNTER — Telehealth: Payer: Self-pay

## 2019-09-07 ENCOUNTER — Telehealth (INDEPENDENT_AMBULATORY_CARE_PROVIDER_SITE_OTHER): Payer: No Typology Code available for payment source | Admitting: Internal Medicine

## 2019-09-07 ENCOUNTER — Encounter: Payer: Self-pay | Admitting: Internal Medicine

## 2019-09-07 ENCOUNTER — Other Ambulatory Visit: Payer: Self-pay

## 2019-09-07 VITALS — Ht 71.5 in | Wt 256.0 lb

## 2019-09-07 DIAGNOSIS — D6869 Other thrombophilia: Secondary | ICD-10-CM | POA: Diagnosis not present

## 2019-09-07 DIAGNOSIS — I4819 Other persistent atrial fibrillation: Secondary | ICD-10-CM

## 2019-09-07 DIAGNOSIS — I5022 Chronic systolic (congestive) heart failure: Secondary | ICD-10-CM | POA: Diagnosis not present

## 2019-09-07 DIAGNOSIS — G4733 Obstructive sleep apnea (adult) (pediatric): Secondary | ICD-10-CM

## 2019-09-07 DIAGNOSIS — I110 Hypertensive heart disease with heart failure: Secondary | ICD-10-CM | POA: Diagnosis not present

## 2019-09-07 DIAGNOSIS — I10 Essential (primary) hypertension: Secondary | ICD-10-CM
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
## 2019-09-07 NOTE — Telephone Encounter (Signed)
Call placed to Pt.  Per Pt he is taking amiodarone 200 mg one tablet by mouth DAILY.  Corrected medication list.  Pt is not home, but [facility] will [facility].  Not sure if records were previously received, not uploaded under media.

## 2019-09-07 NOTE — Addendum Note (Signed)
Addended by: Roney Mans A on: 09/07/2019 12:47 PM   Modules accepted: Orders

## 2019-09-07 NOTE — Telephone Encounter (Signed)
-----   Message from Hillis Range, MD sent at 09/07/2019 12:25 PM EDT ----- Says he had an echo through the [place] in March.  I do not see this.  Also says he had cardiology there.  Can you get those records?  I have advised that we need the results of an echo (last one we have was September), treated OSA--- I have advised that he follow-up on this... before we consider ablation.  Schedule follow-up with me in 4-6 weeks once we have his echo report  Also clarify amiodarone 200mg  daily

## 2019-09-16 ENCOUNTER — Telehealth: Payer: Self-pay

## 2019-09-16 NOTE — Telephone Encounter (Signed)
SPOKE WITH BESTY AT THE [facility] [phone] AND ASKED HER TO FAX OVER THE ECHO DONE IN MARCH, LAST 2 OFFICE NOTES, LABS, EKG, STRESS TEST AND MONITOR RESULTS IF HE HAD ONE. ALSO ASKED FOR CORRECT DOSAGE OF amiodarone 200mg  daily.   SHE  STATES THAT SHE WOULD GET TO IT THIS AFTERNOON OR TOMORROW MORNING

## 2019-09-21 NOTE — Telephone Encounter (Signed)
Records received from [place] and given to MR to scan in.  4 wk f/u to be scheduled.

## 2019-10-19 ENCOUNTER — Telehealth (INDEPENDENT_AMBULATORY_CARE_PROVIDER_SITE_OTHER): Payer: No Typology Code available for payment source | Admitting: Internal Medicine

## 2019-10-19 ENCOUNTER — Telehealth: Payer: Self-pay

## 2019-10-19 ENCOUNTER — Other Ambulatory Visit: Payer: Self-pay

## 2019-10-19 DIAGNOSIS — I4819 Other persistent atrial fibrillation: Secondary | ICD-10-CM

## 2019-10-19 DIAGNOSIS — I4891 Unspecified atrial fibrillation: Secondary | ICD-10-CM

## 2019-10-19 DIAGNOSIS — I428 Other cardiomyopathies: Secondary | ICD-10-CM

## 2019-10-19 DIAGNOSIS — D6869 Other thrombophilia: Secondary | ICD-10-CM | POA: Diagnosis not present

## 2019-10-19 DIAGNOSIS — G4733 Obstructive sleep apnea (adult) (pediatric): Secondary | ICD-10-CM
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
## 2019-10-19 NOTE — Telephone Encounter (Signed)
-----   Message from Hillis Range, MD sent at 10/19/2019 11:17 AM EDT ----- Please forward his sleep study results to his PCP so he can arrange CPAP through the [place].  Afib ablation [facility] Cardiac CT

## 2019-10-19 NOTE — Telephone Encounter (Signed)
Sleep study results and OV note from today faxed to [place].  Pt scheduled for afib ablation on December 22, 2019 at 7:30 am   Will come to [facility] office on July 7 for labs and to discuss instructions

## 2019-10-20 MED ORDER — METOPROLOL TARTRATE 100 MG PO TABS: ORAL_TABLET | ORAL | 0 refills | Status: DC

## 2019-10-20 NOTE — Addendum Note (Signed)
Addended by: Roney Mans A on: 10/20/2019 11:04 AM   Modules accepted: Orders

## 2019-10-20 NOTE — Telephone Encounter (Signed)
Labs/covid test scheduled  Work up complete.

## 2019-12-02 ENCOUNTER — Other Ambulatory Visit: Payer: No Typology Code available for payment source

## 2019-12-02 ENCOUNTER — Telehealth: Payer: Self-pay

## 2019-12-02 NOTE — Telephone Encounter (Signed)
-----   Message from Wiliam Ke, RN sent at 10/20/2019 10:36 AM EDT ----- Regarding: july 7-afib instructions

## 2019-12-04 NOTE — Telephone Encounter (Signed)
Left voicemail with pt to assist with rescheduling his Afib ablation.   Advised to call office for rescheduling of procedure.

## 2019-12-09 ENCOUNTER — Telehealth: Payer: Self-pay | Admitting: *Deleted

## 2019-12-09 ENCOUNTER — Encounter: Payer: Self-pay | Admitting: *Deleted

## 2019-12-09 NOTE — Telephone Encounter (Signed)
3rd call placed to patient to assist with rescheduling ablation.   3 voicemails left with number for call back.  Will send letter to home for reschedule reminder.

## 2019-12-10 ENCOUNTER — Encounter: Payer: Self-pay | Admitting: *Deleted

## 2019-12-10 ENCOUNTER — Telehealth: Payer: Self-pay | Admitting: Internal Medicine

## 2019-12-10 NOTE — Telephone Encounter (Signed)
Spoke with Abby at [place].  Advised had attempted to contact Pt to reschedule ablation with no response.  Abby spoke with Pt and advised Pt to reschedule with [facility].  Call placed to Pt.  Rescheduled afib ablation for January 19, 2020  Will reschedule labs/covid test and cardiac CT  Will meet with Pt on August 9 to go over instructions.  Work up complete.

## 2019-12-10 NOTE — Telephone Encounter (Signed)
New message   Per Abby needs to discuss getting patient rescheduled for his ablation. Please call to discuss.

## 2019-12-15 ENCOUNTER — Ambulatory Visit (HOSPITAL_COMMUNITY): Payer: No Typology Code available for payment source

## 2019-12-19 ENCOUNTER — Other Ambulatory Visit (HOSPITAL_COMMUNITY): Payer: No Typology Code available for payment source

## 2019-12-22 ENCOUNTER — Ambulatory Visit (HOSPITAL_COMMUNITY): Admit: 2019-12-22 | Payer: No Typology Code available for payment source | Admitting: Internal Medicine

## 2019-12-22 ENCOUNTER — Encounter (HOSPITAL_COMMUNITY): Payer: Self-pay

## 2019-12-22 SURGERY — ATRIAL FIBRILLATION ABLATION
Anesthesia: General

## 2020-01-04 ENCOUNTER — Other Ambulatory Visit: Payer: Self-pay

## 2020-01-04 ENCOUNTER — Other Ambulatory Visit: Payer: No Typology Code available for payment source | Admitting: *Deleted

## 2020-01-04 DIAGNOSIS — I4891 Unspecified atrial fibrillation: Secondary | ICD-10-CM

## 2020-01-04 LAB — CBC WITH DIFFERENTIAL/PLATELET
Basophils Absolute: 0 10*3/uL (ref 0.0–0.2)
Basos: 0 %
EOS (ABSOLUTE): 0.3 10*3/uL (ref 0.0–0.4)
Eos: 4 %
Hematocrit: 43.1 % (ref 37.5–51.0)
Hemoglobin: 14.8 g/dL (ref 13.0–17.7)
Lymphocytes Absolute: 2.8 10*3/uL (ref 0.7–3.1)
Lymphs: 34 %
MCH: 32 pg (ref 26.6–33.0)
MCHC: 34.3 g/dL (ref 31.5–35.7)
MCV: 93 fL (ref 79–97)
Monocytes Absolute: 1.1 10*3/uL — ABNORMAL HIGH (ref 0.1–0.9)
Monocytes: 14 %
Neutrophils Absolute: 3.8 10*3/uL (ref 1.4–7.0)
Neutrophils: 48 %
Platelets: 281 10*3/uL (ref 150–450)
RBC: 4.63 x10E6/uL (ref 4.14–5.80)
RDW: 14 % (ref 11.6–15.4)
WBC: 8.1 10*3/uL (ref 3.4–10.8)

## 2020-01-04 LAB — BASIC METABOLIC PANEL
BUN/Creatinine Ratio: 14 (ref 9–20)
BUN: 16 mg/dL (ref 6–24)
CO2: 27 mmol/L (ref 20–29)
Calcium: 9.4 mg/dL (ref 8.7–10.2)
Chloride: 105 mmol/L (ref 96–106)
Creatinine, Ser: 1.13 mg/dL (ref 0.76–1.27)
GFR calc Af Amer: 86 mL/min/{1.73_m2} (ref >59)
GFR calc non Af Amer: 74 mL/min/{1.73_m2} (ref >59)
Glucose: 134 mg/dL — ABNORMAL HIGH (ref 65–99)
Potassium: 4.2 mmol/L (ref 3.5–5.2)
Sodium: 140 mmol/L (ref 134–144)

## 2020-01-11 ENCOUNTER — Telehealth (HOSPITAL_COMMUNITY): Payer: Self-pay | Admitting: Emergency Medicine

## 2020-01-11 MED ORDER — METOPROLOL TARTRATE 100 MG PO TABS: ORAL_TABLET | ORAL | 0 refills | Status: DC

## 2020-01-11 NOTE — Telephone Encounter (Signed)
unable to leave VM, box full

## 2020-01-11 NOTE — Telephone Encounter (Signed)
Pt returning phone call regarding upcoming cardiac imaging study; pt verbalizes understanding of appt date/time, parking situation and where to check in, pre-test NPO status and medications ordered, and verified current allergies; name and call back number provided for further questions should they arise Lucifer Soja RN Navigator Cardiac Imaging [facility] [phone] office [phone] cell   

## 2020-01-11 NOTE — Addendum Note (Signed)
Addended by: Roney Mans A on: 01/11/2020 12:23 PM   Modules accepted: Orders

## 2020-01-13 ENCOUNTER — Ambulatory Visit (HOSPITAL_COMMUNITY)
Admission: RE | Admit: 2020-01-13 | Discharge: 2020-01-13 | Disposition: A | Discharge status: Home / Self Care | Payer: No Typology Code available for payment source | Source: Ambulatory Visit | Attending: Internal Medicine | Admitting: Internal Medicine

## 2020-01-13 ENCOUNTER — Encounter (HOSPITAL_COMMUNITY): Payer: Self-pay

## 2020-01-13 DIAGNOSIS — I4891 Unspecified atrial fibrillation: Secondary | ICD-10-CM | POA: Insufficient documentation

## 2020-01-13 MED ORDER — IOHEXOL 350 MG/ML SOLN
80.0000 mL | Freq: Once | INTRAVENOUS | Status: AC | PRN
  Administered 2020-01-13: 80 mL via INTRAVENOUS

## 2020-01-15 ENCOUNTER — Other Ambulatory Visit (HOSPITAL_COMMUNITY)
Admission: RE | Admit: 2020-01-15 | Discharge: 2020-01-15 | Disposition: A | Discharge status: Home / Self Care | Payer: No Typology Code available for payment source | Source: Ambulatory Visit | Attending: Internal Medicine | Admitting: Internal Medicine

## 2020-01-15 DIAGNOSIS — Z20822 Contact with and (suspected) exposure to covid-19: Secondary | ICD-10-CM | POA: Insufficient documentation

## 2020-01-15 DIAGNOSIS — Z01812 Encounter for preprocedural laboratory examination: Secondary | ICD-10-CM | POA: Insufficient documentation

## 2020-01-15 LAB — SARS CORONAVIRUS 2 (TAT 6-24 HRS): SARS Coronavirus 2: NEGATIVE

## 2020-01-18 ENCOUNTER — Ambulatory Visit (HOSPITAL_COMMUNITY)
Admission: RE | Admit: 2020-01-18 | Discharge: 2020-01-18 | Disposition: A | Discharge status: Home / Self Care | Payer: No Typology Code available for payment source | Source: Ambulatory Visit | Attending: Internal Medicine | Admitting: Internal Medicine

## 2020-01-18 DIAGNOSIS — I4891 Unspecified atrial fibrillation: Secondary | ICD-10-CM | POA: Insufficient documentation

## 2020-01-18 MED ORDER — IOHEXOL 350 MG/ML SOLN
80.0000 mL | Freq: Once | INTRAVENOUS | Status: AC | PRN
  Administered 2020-01-18: 80 mL via INTRAVENOUS

## 2020-01-18 NOTE — Progress Notes (Signed)
Called patient to go over instructions for procedure for tomorrow.   Arrival time 0530, nothing to eat or drink after midnight.  No meds am of procedure.  Take both doses of Eliquis today, none tomorrow.  A responsible adult to drive you home and stay overnight with you.

## 2020-01-18 NOTE — Anesthesia Preprocedure Evaluation (Addendum)
Anesthesia Evaluation  Patient identified by MRN, date of birth, ID band Patient awake    Reviewed: Allergy & Precautions, NPO status , Patient's Chart, lab work & pertinent test results  History of Anesthesia Complications Negative for: history of anesthetic complications  Airway Mallampati: II  TM Distance: >3 FB     Dental no notable dental hx. (+) Dental Advisory Given   Pulmonary sleep apnea and Continuous Positive Airway Pressure Ventilation , Current Smoker and Patient abstained from smoking., former smoker,    breath sounds clear to auscultation       Cardiovascular hypertension, Pt. on medications and Pt. on home beta blockers +CHF  + dysrhythmias  Rhythm:Irregular Rate:Normal  EF 20-25%. Moderately reduced RV    Neuro/Psych negative neurological ROS     GI/Hepatic negative GI ROS, Neg liver ROS,   Endo/Other  diabetesMorbid obesity  Renal/GU negative Renal ROS     Musculoskeletal   Abdominal   Peds  Hematology negative hematology ROS (+)   Anesthesia Other Findings   Reproductive/Obstetrics                            Anesthesia Physical  Anesthesia Plan  ASA: IV  Anesthesia Plan: General   Post-op Pain Management:    Induction: Intravenous  PONV Risk Score and Plan: 2 and Ondansetron, Treatment may vary due to age or medical condition and Dexamethasone  Airway Management Planned: Oral ETT  Additional Equipment: Arterial line  Intra-op Plan:   Post-operative Plan: Extubation in OR  Informed Consent: I have reviewed the patients History and Physical, chart, labs and discussed the procedure including the risks, benefits and alternatives for the proposed anesthesia with the patient or authorized representative who has indicated his/her understanding and acceptance.     Dental advisory given  Plan Discussed with: Anesthesiologist and CRNA  Anesthesia Plan  Comments:        Anesthesia Quick Evaluation

## 2020-01-19 ENCOUNTER — Ambulatory Visit (HOSPITAL_COMMUNITY): Payer: No Typology Code available for payment source | Admitting: Physician Assistant

## 2020-01-19 ENCOUNTER — Ambulatory Visit (HOSPITAL_COMMUNITY)
Admission: RE | Admit: 2020-01-19 | Discharge: 2020-01-19 | Disposition: A | Discharge status: Home / Self Care | Payer: No Typology Code available for payment source | Attending: Internal Medicine | Admitting: Internal Medicine

## 2020-01-19 ENCOUNTER — Ambulatory Visit (HOSPITAL_COMMUNITY): Payer: No Typology Code available for payment source | Admitting: Certified Registered Nurse Anesthetist

## 2020-01-19 ENCOUNTER — Encounter (HOSPITAL_COMMUNITY)
Admission: RE | Disposition: A | Discharge status: Home / Self Care | Payer: No Typology Code available for payment source | Source: Home / Self Care | Attending: Internal Medicine

## 2020-01-19 DIAGNOSIS — E785 Hyperlipidemia, unspecified: Secondary | ICD-10-CM | POA: Insufficient documentation

## 2020-01-19 DIAGNOSIS — G4733 Obstructive sleep apnea (adult) (pediatric): Secondary | ICD-10-CM | POA: Diagnosis not present

## 2020-01-19 DIAGNOSIS — Z79899 Other long term (current) drug therapy: Secondary | ICD-10-CM | POA: Insufficient documentation

## 2020-01-19 DIAGNOSIS — I429 Cardiomyopathy, unspecified: Secondary | ICD-10-CM | POA: Diagnosis not present

## 2020-01-19 DIAGNOSIS — Z7901 Long term (current) use of anticoagulants: Secondary | ICD-10-CM | POA: Diagnosis not present

## 2020-01-19 DIAGNOSIS — I4819 Other persistent atrial fibrillation: Secondary | ICD-10-CM | POA: Diagnosis present

## 2020-01-19 DIAGNOSIS — I10 Essential (primary) hypertension: Secondary | ICD-10-CM | POA: Insufficient documentation

## 2020-01-19 DIAGNOSIS — Z87891 Personal history of nicotine dependence: Secondary | ICD-10-CM | POA: Insufficient documentation

## 2020-01-19 DIAGNOSIS — Z6835 Body mass index (BMI) 35.0-35.9, adult: Secondary | ICD-10-CM | POA: Diagnosis not present

## 2020-01-19 HISTORY — PX: ATRIAL FIBRILLATION ABLATION: EP1191

## 2020-01-19 LAB — GLUCOSE, CAPILLARY
Glucose-Capillary: 140 mg/dL — ABNORMAL HIGH (ref 70–99)
Glucose-Capillary: 164 mg/dL — ABNORMAL HIGH (ref 70–99)

## 2020-01-19 LAB — POCT ACTIVATED CLOTTING TIME
Activated Clotting Time: 224 seconds
Activated Clotting Time: 224 seconds
Activated Clotting Time: 257 seconds

## 2020-01-19 SURGERY — ATRIAL FIBRILLATION ABLATION
Anesthesia: General

## 2020-01-19 MED ORDER — APIXABAN 5 MG PO TABS
5.0000 mg | ORAL_TABLET | ORAL | Status: AC
  Administered 2020-01-19: 5 mg via ORAL
  Filled 2020-01-19: qty 1

## 2020-01-19 MED ORDER — ISOPROTERENOL HCL 0.2 MG/ML IJ SOLN
INTRAVENOUS | Status: DC | PRN
  Administered 2020-01-19: 10 ug/min via INTRAVENOUS

## 2020-01-19 MED ORDER — HEPARIN SODIUM (PORCINE) 1000 UNIT/ML IJ SOLN
INTRAMUSCULAR | Status: DC | PRN
  Administered 2020-01-19 (×2): 5000 [IU] via INTRAVENOUS
  Administered 2020-01-19: 18000 [IU] via INTRAVENOUS
  Administered 2020-01-19: 5000 [IU] via INTRAVENOUS

## 2020-01-19 MED ORDER — SUCCINYLCHOLINE CHLORIDE 20 MG/ML IJ SOLN
INTRAMUSCULAR | Status: DC | PRN
  Administered 2020-01-19: 120 mg via INTRAVENOUS

## 2020-01-19 MED ORDER — ONDANSETRON HCL 4 MG/2ML IJ SOLN: 4.0000 mg | Freq: Four times a day (QID) | INTRAMUSCULAR | Status: DC | PRN

## 2020-01-19 MED ORDER — PHENYLEPHRINE HCL-NACL 10-0.9 MG/250ML-% IV SOLN
INTRAVENOUS | Status: DC | PRN
  Administered 2020-01-19: 25 ug/min via INTRAVENOUS

## 2020-01-19 MED ORDER — LIDOCAINE 2% (20 MG/ML) 5 ML SYRINGE
INTRAMUSCULAR | Status: DC | PRN
  Administered 2020-01-19: 100 mg via INTRAVENOUS

## 2020-01-19 MED ORDER — PROTAMINE SULFATE 10 MG/ML IV SOLN
INTRAVENOUS | Status: DC | PRN
  Administered 2020-01-19: 35 mg via INTRAVENOUS

## 2020-01-19 MED ORDER — HEPARIN (PORCINE) IN NACL 1000-0.9 UT/500ML-% IV SOLN
INTRAVENOUS | Status: AC
  Filled 2020-01-19: qty 500

## 2020-01-19 MED ORDER — ROCURONIUM BROMIDE 10 MG/ML (PF) SYRINGE
PREFILLED_SYRINGE | INTRAVENOUS | Status: DC | PRN
  Administered 2020-01-19 (×2): 20 mg via INTRAVENOUS
  Administered 2020-01-19: 50 mg via INTRAVENOUS
  Administered 2020-01-19: 20 mg via INTRAVENOUS

## 2020-01-19 MED ORDER — ONDANSETRON HCL 4 MG/2ML IJ SOLN
INTRAMUSCULAR | Status: DC | PRN
  Administered 2020-01-19: 4 mg via INTRAVENOUS

## 2020-01-19 MED ORDER — SODIUM CHLORIDE 0.9 % IV SOLN: 250.0000 mL | INTRAVENOUS | Status: DC | PRN

## 2020-01-19 MED ORDER — PROPOFOL 10 MG/ML IV BOLUS
INTRAVENOUS | Status: DC | PRN
  Administered 2020-01-19 (×2): 100 mg via INTRAVENOUS

## 2020-01-19 MED ORDER — FENTANYL CITRATE (PF) 250 MCG/5ML IJ SOLN
INTRAMUSCULAR | Status: DC | PRN
Start: 2020-01-19 — End: 2020-01-19
  Administered 2020-01-19: 50 ug via INTRAVENOUS
  Administered 2020-01-19: 100 ug via INTRAVENOUS

## 2020-01-19 MED ORDER — SODIUM CHLORIDE 0.9 % IV SOLN: INTRAVENOUS | Status: DC

## 2020-01-19 MED ORDER — HEPARIN (PORCINE) IN NACL 1000-0.9 UT/500ML-% IV SOLN
INTRAVENOUS | Status: DC | PRN
  Administered 2020-01-19 (×2): 500 mL

## 2020-01-19 MED ORDER — PANTOPRAZOLE SODIUM 40 MG PO TBEC: 40.0000 mg | DELAYED_RELEASE_TABLET | Freq: Every day | ORAL | 0 refills | Status: AC

## 2020-01-19 MED ORDER — SODIUM CHLORIDE 0.9% FLUSH: 3.0000 mL | INTRAVENOUS | Status: DC | PRN

## 2020-01-19 MED ORDER — ACETAMINOPHEN 325 MG PO TABS: 650.0000 mg | ORAL_TABLET | ORAL | Status: DC | PRN

## 2020-01-19 MED ORDER — HEPARIN SODIUM (PORCINE) 1000 UNIT/ML IJ SOLN
INTRAMUSCULAR | Status: DC | PRN
  Administered 2020-01-19: 1000 [IU] via INTRAVENOUS

## 2020-01-19 MED ORDER — SUGAMMADEX SODIUM 200 MG/2ML IV SOLN
INTRAVENOUS | Status: DC | PRN
  Administered 2020-01-19: 400 mg via INTRAVENOUS

## 2020-01-19 MED ORDER — MIDAZOLAM HCL 5 MG/5ML IJ SOLN
INTRAMUSCULAR | Status: DC | PRN
  Administered 2020-01-19: 1 mg via INTRAVENOUS

## 2020-01-19 MED ORDER — HEPARIN SODIUM (PORCINE) 1000 UNIT/ML IJ SOLN
INTRAMUSCULAR | Status: AC
  Filled 2020-01-19: qty 1

## 2020-01-19 MED ORDER — SODIUM CHLORIDE 0.9% FLUSH: 3.0000 mL | Freq: Two times a day (BID) | INTRAVENOUS | Status: DC

## 2020-01-19 MED ORDER — DEXAMETHASONE SODIUM PHOSPHATE 10 MG/ML IJ SOLN
INTRAMUSCULAR | Status: DC | PRN
  Administered 2020-01-19: 5 mg via INTRAVENOUS

## 2020-01-19 MED ORDER — HYDROCODONE-ACETAMINOPHEN 5-325 MG PO TABS: 1.0000 | ORAL_TABLET | ORAL | Status: DC | PRN

## 2020-01-19 MED ORDER — ISOPROTERENOL HCL 0.2 MG/ML IJ SOLN
INTRAMUSCULAR | Status: AC
  Filled 2020-01-19: qty 5

## 2020-01-19 SURGICAL SUPPLY — 20 items
BLANKET WARM UNDERBOD FULL ACC (MISCELLANEOUS) ×3 IMPLANT
CATH 8FR REPROCESSED SOUNDSTAR (CATHETERS) ×3 IMPLANT
CATH MAPPNG PENTARAY F 2-6-2MM (CATHETERS) ×1 IMPLANT
CATH S CIRCA THERM PROBE 10F (CATHETERS) ×3 IMPLANT
CATH SMTCH THERMOCOOL SF FJ (CATHETERS) ×3 IMPLANT
CATH WEBSTER BI DIR CS D-F CRV (CATHETERS) ×3 IMPLANT
COVER SWIFTLINK CONNECTOR (BAG) ×3 IMPLANT
DEVICE CLOSURE PERCLS PRGLD 6F (VASCULAR PRODUCTS) ×3 IMPLANT
MAT PREVALON FULL STRYKER (MISCELLANEOUS) ×3 IMPLANT
PACK EP LATEX FREE (CUSTOM PROCEDURE TRAY) ×3
PACK EP LF (CUSTOM PROCEDURE TRAY) ×1 IMPLANT
PAD PRO RADIOLUCENT 2001M-C (PAD) ×3 IMPLANT
PATCH CARTO3 (PAD) ×3 IMPLANT
PENTARAY F 2-6-2MM (CATHETERS) ×3
PERCLOSE PROGLIDE 6F (VASCULAR PRODUCTS) ×9
SHEATH BAYLIS TRANSSEPTAL 98CM (NEEDLE) ×3 IMPLANT
SHEATH CARTO VIZIGO SM CVD (SHEATH) ×3 IMPLANT
SHEATH PINNACLE 8F 10CM (SHEATH) ×6 IMPLANT
SHEATH PINNACLE 9F 10CM (SHEATH) ×3 IMPLANT
TUBING SMART ABLATE COOLFLOW (TUBING) ×3 IMPLANT

## 2020-01-19 NOTE — Progress Notes (Signed)
Patient and wife was given discharge instructions. Both verbalized understanding. 

## 2020-01-19 NOTE — Transfer of Care (Signed)
Immediate Anesthesia Transfer of Care Note  Patient: Robert Middleton  Procedure(s) Performed: ATRIAL FIBRILLATION ABLATION (N/A )  Patient Location: Cath Lab  Anesthesia Type:General  Level of Consciousness: awake, alert  and oriented  Airway & Oxygen Therapy: Patient Spontanous Breathing and Patient connected to nasal cannula oxygen  Post-op Assessment: Report given to RN, Post -op Vital signs reviewed and stable and Patient moving all extremities X 4  Post vital signs: Reviewed and stable  Last Vitals:  Vitals Value Taken Time  BP    Temp    Pulse 69 01/19/20 1056  Resp 17 01/19/20 1056  SpO2 100 % 01/19/20 1056  Vitals shown include unvalidated device data.  Last Pain:  Vitals:   01/19/20 0644  TempSrc:   PainSc: 0-No pain      Patients Stated Pain Goal: 3 (01/19/20 9449)  Complications: No complications documented.

## 2020-01-19 NOTE — Anesthesia Procedure Notes (Signed)
Arterial Line Insertion Start/End8/24/2021 7:00 AM, 01/19/2020 7:15 AM Performed by: Lanell Matar, CRNA, CRNA  Patient location: Pre-op. Preanesthetic checklist: patient identified, IV checked, site marked, risks and benefits discussed, surgical consent, monitors and equipment checked, pre-op evaluation, timeout performed and anesthesia consent Lidocaine 1% used for infiltration Right, radial was placed Catheter size: 20 G Hand hygiene performed  and maximum sterile barriers used   Attempts: 1 Procedure performed without using ultrasound guided technique. Following insertion, dressing applied and Biopatch. Post procedure assessment: normal  Patient tolerated the procedure well with no immediate complications.

## 2020-01-19 NOTE — Anesthesia Procedure Notes (Signed)
Procedure Name: Intubation Date/Time: 01/19/2020 7:59 AM Performed by: Lanell Matar, CRNA Pre-anesthesia Checklist: Patient identified, Emergency Drugs available, Suction available and Patient being monitored Patient Re-evaluated:Patient Re-evaluated prior to induction Oxygen Delivery Method: Circle System Utilized Preoxygenation: Pre-oxygenation with 100% oxygen Induction Type: IV induction Ventilation: Mask ventilation without difficulty and Two handed mask ventilation required Laryngoscope Size: Miller and 2 Grade View: Grade I Tube type: Oral Tube size: 7.5 mm Number of attempts: 1 Airway Equipment and Method: Stylet and Oral airway Placement Confirmation: ETT inserted through vocal cords under direct vision,  positive ETCO2 and breath sounds checked- equal and bilateral Secured at: 23 cm Tube secured with: Tape Dental Injury: Teeth and Oropharynx as per pre-operative assessment
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
## 2020-01-19 NOTE — Anesthesia Postprocedure Evaluation (Signed)
Anesthesia Post Note  Patient: Robert Middleton  Procedure(s) Performed: ATRIAL FIBRILLATION ABLATION (N/A )     Patient location during evaluation: PACU Anesthesia Type: General Level of consciousness: sedated Pain management: pain level controlled Vital Signs Assessment: post-procedure vital signs reviewed and stable Respiratory status: spontaneous breathing and respiratory function stable Cardiovascular status: stable Postop Assessment: no apparent nausea or vomiting Anesthetic complications: no   No complications documented.  Last Vitals:  Vitals:   01/19/20 1235 01/19/20 1240  BP:    Pulse: 62 64  Resp: 12 12  Temp:    SpO2: 99% 100%    Last Pain:  Vitals:   01/19/20 1215  TempSrc: Temporal  PainSc:                  Maycee Blasco DANIEL

## 2020-01-19 NOTE — Discharge Instructions (Signed)
Post procedure care instructions No driving for 4 days. No lifting over 5 lbs for 1 week. No vigorous or sexual activity for 1 week. You may return to work/your usual activities on 01/26/2020. Keep procedure site clean & dry. If you notice increased pain, swelling, bleeding or pus, call/return!  You may shower, but no soaking baths/hot tubs/pools for 1 week.    You have an appointment set up with the Atrial Fibrillation Clinic.  Multiple studies have shown that being followed by a dedicated atrial fibrillation clinic in addition to the standard care you receive from your other physicians improves health. We believe that enrollment in the atrial fibrillation clinic will allow [place] to better care for you.   The phone number to the Atrial Fibrillation Clinic is [phone]. The clinic is staffed Monday through Friday from 8:30am to 5pm.  Parking Directions: The clinic is located in the Heart and Vascular Building connected to [facility]. 1)From [address] turn on to [address] and go to the 3rd entrance  (Heart and Vascular entrance) on the right. 2)Look to the right for Heart &Vascular Parking Garage. 3)A code for the entrance is required, for August is 4008.   4)Take the elevators to the 1st floor. Registration is in the room with the glass walls at the end of the hallway.  If you have any trouble parking or locating the clinic, please don't hesitate to call [phone].

## 2020-01-20 ENCOUNTER — Encounter (HOSPITAL_COMMUNITY): Payer: Self-pay | Admitting: Internal Medicine

## 2020-01-20 LAB — POCT ACTIVATED CLOTTING TIME: Activated Clotting Time: 224 seconds

## 2020-01-22 ENCOUNTER — Encounter (HOSPITAL_COMMUNITY): Payer: Self-pay | Admitting: *Deleted

## 2020-02-16 ENCOUNTER — Encounter (HOSPITAL_COMMUNITY): Payer: Self-pay | Admitting: Physician Assistant

## 2020-02-16 ENCOUNTER — Ambulatory Visit (HOSPITAL_COMMUNITY)
Admission: RE | Admit: 2020-02-16 | Discharge: 2020-02-16 | Disposition: A | Discharge status: Home / Self Care | Payer: No Typology Code available for payment source | Source: Ambulatory Visit | Attending: Physician Assistant | Admitting: Physician Assistant

## 2020-02-16 ENCOUNTER — Other Ambulatory Visit: Payer: Self-pay

## 2020-02-16 ENCOUNTER — Other Ambulatory Visit (HOSPITAL_COMMUNITY): Payer: Self-pay | Admitting: Cardiology

## 2020-02-16 VITALS — BP 118/74 | HR 78 | Ht 71.5 in | Wt 258.6 lb

## 2020-02-16 DIAGNOSIS — I428 Other cardiomyopathies: Secondary | ICD-10-CM | POA: Insufficient documentation

## 2020-02-16 DIAGNOSIS — G4733 Obstructive sleep apnea (adult) (pediatric): Secondary | ICD-10-CM | POA: Insufficient documentation

## 2020-02-16 DIAGNOSIS — Z79899 Other long term (current) drug therapy: Secondary | ICD-10-CM | POA: Diagnosis not present

## 2020-02-16 DIAGNOSIS — Z6835 Body mass index (BMI) 35.0-35.9, adult: Secondary | ICD-10-CM | POA: Diagnosis not present

## 2020-02-16 DIAGNOSIS — I4819 Other persistent atrial fibrillation: Secondary | ICD-10-CM | POA: Diagnosis not present

## 2020-02-16 DIAGNOSIS — I10 Essential (primary) hypertension: Secondary | ICD-10-CM | POA: Insufficient documentation

## 2020-02-16 DIAGNOSIS — Z7901 Long term (current) use of anticoagulants: Secondary | ICD-10-CM | POA: Diagnosis not present

## 2020-02-16 DIAGNOSIS — Z87891 Personal history of nicotine dependence: Secondary | ICD-10-CM | POA: Diagnosis not present

## 2020-02-16 DIAGNOSIS — Z7984 Long term (current) use of oral hypoglycemic drugs: Secondary | ICD-10-CM | POA: Insufficient documentation

## 2020-02-16 DIAGNOSIS — D6869 Other thrombophilia: Secondary | ICD-10-CM | POA: Insufficient documentation

## 2020-02-16 DIAGNOSIS — E785 Hyperlipidemia, unspecified: Secondary | ICD-10-CM | POA: Diagnosis not present

## 2020-02-16 MED ORDER — ENTRESTO 97-103 MG PO TABS: 0.5000 | ORAL_TABLET | Freq: Two times a day (BID) | ORAL | 0 refills | Status: AC

## 2020-02-16 MED ORDER — SPIRONOLACTONE 25 MG PO TABS: 25.0000 mg | ORAL_TABLET | Freq: Every day | ORAL | 0 refills | Status: AC

## 2020-02-16 MED ORDER — ROSUVASTATIN CALCIUM 20 MG PO TABS: 20.0000 mg | ORAL_TABLET | Freq: Every day | ORAL | 0 refills | Status: DC

## 2020-02-16 MED ORDER — FUROSEMIDE 20 MG PO TABS: 20.0000 mg | ORAL_TABLET | ORAL | 0 refills | Status: AC | PRN

## 2020-02-16 MED ORDER — APIXABAN 5 MG PO TABS: 5.0000 mg | ORAL_TABLET | Freq: Two times a day (BID) | ORAL | 0 refills | Status: AC
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
## 2020-02-16 NOTE — Telephone Encounter (Signed)
Patient in the office for visit with afib clinic Patient reports he only has 11 days worth of medication left Refills addressed- advised to schedule follow up

## 2020-03-21 ENCOUNTER — Ambulatory Visit: Payer: No Typology Code available for payment source | Admitting: Internal Medicine
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
## 2020-03-25 ENCOUNTER — Inpatient Hospital Stay (HOSPITAL_BASED_OUTPATIENT_CLINIC_OR_DEPARTMENT_OTHER)
Admission: RE | Admit: 2020-03-25 | Discharge: 2020-03-25 | Disposition: A | Discharge status: Home / Self Care | Payer: No Typology Code available for payment source | Source: Ambulatory Visit | Attending: Internal Medicine | Admitting: Internal Medicine

## 2020-03-25 DIAGNOSIS — I5022 Chronic systolic (congestive) heart failure: Secondary | ICD-10-CM

## 2020-04-01 ENCOUNTER — Encounter: Payer: Self-pay | Admitting: Gastroenterology

## 2020-04-18 ENCOUNTER — Ambulatory Visit: Payer: Self-pay | Admitting: Internal Medicine

## 2020-05-05 ENCOUNTER — Ambulatory Visit: Payer: No Typology Code available for payment source | Admitting: Gastroenterology

## 2020-05-11 ENCOUNTER — Ambulatory Visit: Payer: Self-pay | Admitting: Gastroenterology

## 2021-09-03 IMAGING — DX DG CHEST 2V
2 series · 2 of 2 positions shown · non-contrast
Comparison: None.

CLINICAL DATA: Shortness of breath

EXAM:
CHEST - 2 VIEW

[w chest pa]
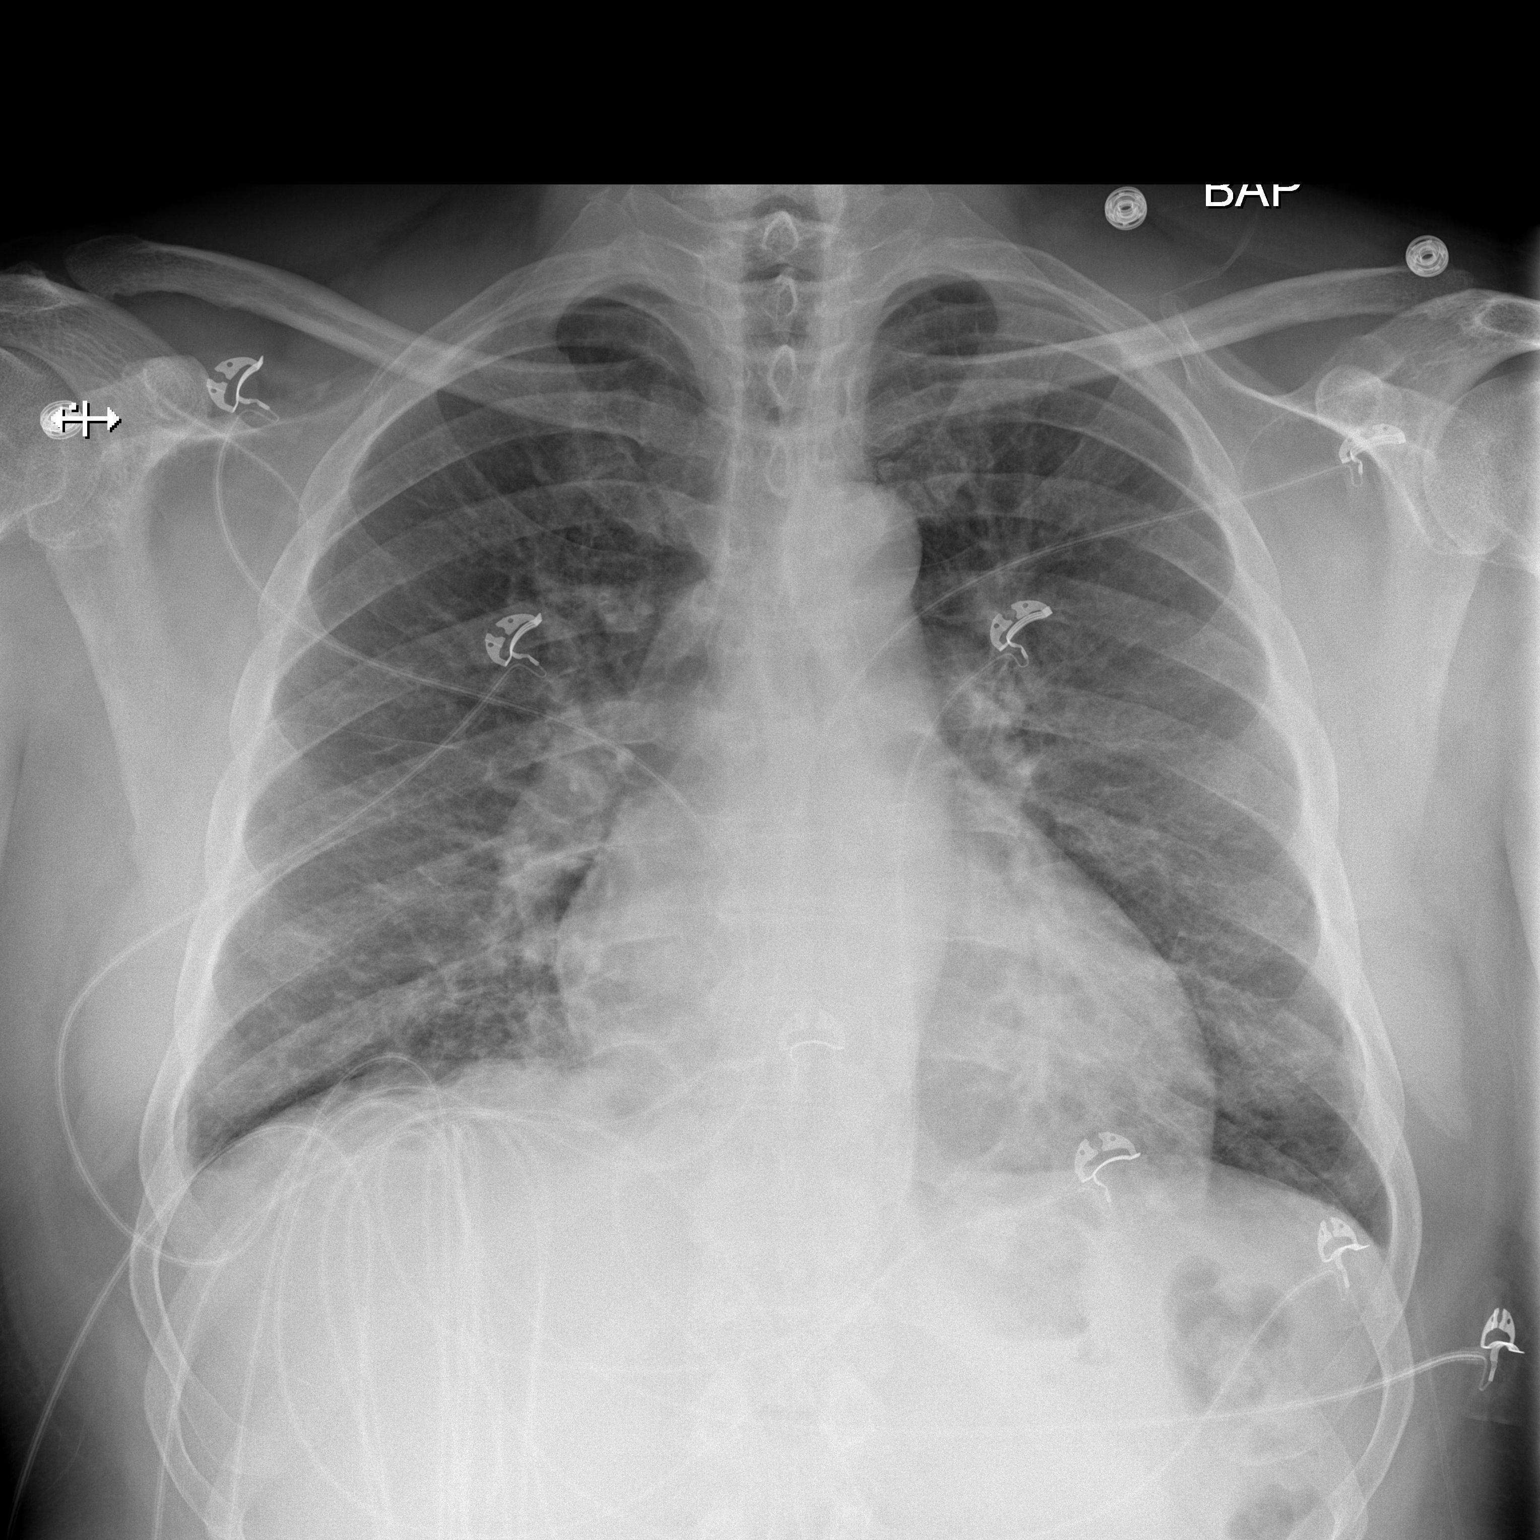

[w chest lat]
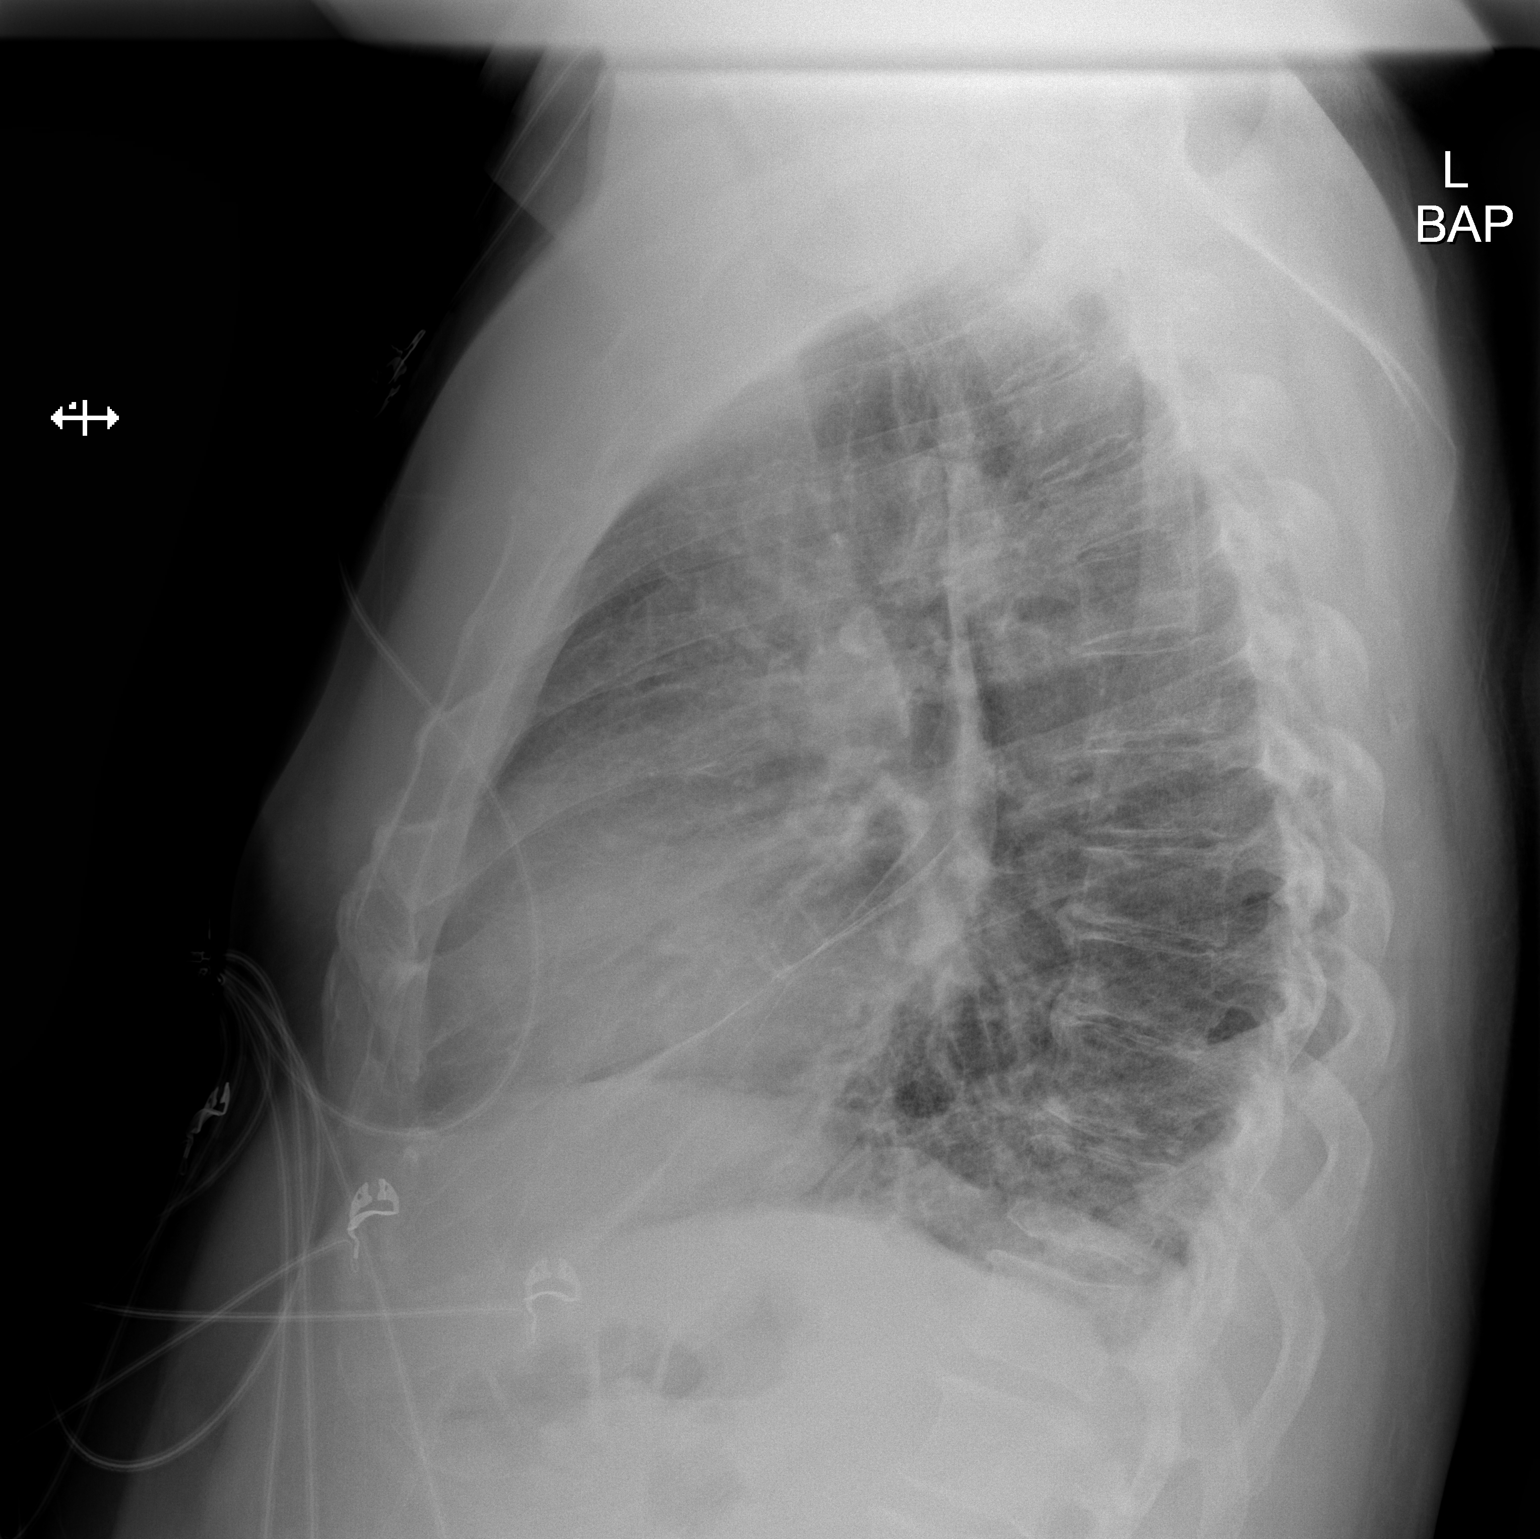

[2 of 2 positions shown; findings below may reference images not displayed]

FINDINGS: Cardiomegaly. Mild, diffuse bilateral interstitial pulmonary opacity
and trace pleural effusions. Disc degenerative disease of the
thoracic spine.
IMPRESSION: Cardiomegaly with mild, diffuse bilateral interstitial pulmonary
opacity and trace pleural effusions, likely edema. No focal airspace
opacity.
